# Patient Record
Sex: Female | Born: 1955 | Race: White | Hispanic: No | Marital: Married | Smoking: Never smoker
Health system: Southern US, Community
[De-identification: ages and names within clinical notes are randomized; demographics above are authoritative.]

## PROBLEM LIST (undated history)

## (undated) DIAGNOSIS — Z9889 Other specified postprocedural states: Secondary | ICD-10-CM

## (undated) DIAGNOSIS — E785 Hyperlipidemia, unspecified: Secondary | ICD-10-CM

## (undated) DIAGNOSIS — E559 Vitamin D deficiency, unspecified: Secondary | ICD-10-CM

## (undated) HISTORY — DX: Hyperlipidemia, unspecified: E78.5

## (undated) HISTORY — PX: OTHER SURGICAL HISTORY: SHX169

## (undated) HISTORY — DX: Vitamin D deficiency, unspecified: E55.9

---

## 1989-05-19 HISTORY — PX: ABDOMINAL HYSTERECTOMY: SUR658

## 2001-10-04 ENCOUNTER — Ambulatory Visit (HOSPITAL_COMMUNITY): Admission: RE | Admit: 2001-10-04 | Discharge: 2001-10-04 | Payer: Self-pay | Admitting: Pulmonary Disease

## 2001-11-17 ENCOUNTER — Ambulatory Visit (HOSPITAL_COMMUNITY): Admission: RE | Admit: 2001-11-17 | Discharge: 2001-11-17 | Payer: Self-pay | Admitting: Pulmonary Disease

## 2001-12-07 ENCOUNTER — Ambulatory Visit (HOSPITAL_COMMUNITY): Admission: RE | Admit: 2001-12-07 | Discharge: 2001-12-07 | Payer: Self-pay | Admitting: Cardiology

## 2001-12-08 ENCOUNTER — Ambulatory Visit (HOSPITAL_COMMUNITY): Admission: RE | Admit: 2001-12-08 | Discharge: 2001-12-08 | Payer: Self-pay | Admitting: Cardiology

## 2002-01-04 ENCOUNTER — Ambulatory Visit (HOSPITAL_COMMUNITY): Admission: RE | Admit: 2002-01-04 | Discharge: 2002-01-04 | Payer: Self-pay | Admitting: Pulmonary Disease

## 2002-03-15 ENCOUNTER — Ambulatory Visit (HOSPITAL_COMMUNITY): Admission: RE | Admit: 2002-03-15 | Discharge: 2002-03-15 | Payer: Self-pay | Admitting: Pulmonary Disease

## 2002-03-17 ENCOUNTER — Encounter (HOSPITAL_COMMUNITY): Admission: RE | Admit: 2002-03-17 | Discharge: 2002-04-16 | Payer: Self-pay | Admitting: Pulmonary Disease

## 2004-02-26 ENCOUNTER — Ambulatory Visit (HOSPITAL_COMMUNITY): Admission: RE | Admit: 2004-02-26 | Discharge: 2004-02-26 | Payer: Self-pay | Admitting: Specialist

## 2005-03-04 ENCOUNTER — Ambulatory Visit (HOSPITAL_COMMUNITY): Admission: RE | Admit: 2005-03-04 | Discharge: 2005-03-04 | Payer: Self-pay | Admitting: Specialist

## 2005-04-09 ENCOUNTER — Ambulatory Visit (HOSPITAL_COMMUNITY): Admission: RE | Admit: 2005-04-09 | Discharge: 2005-04-09 | Payer: Self-pay | Admitting: Specialist

## 2006-04-10 ENCOUNTER — Ambulatory Visit (HOSPITAL_COMMUNITY): Admission: RE | Admit: 2006-04-10 | Discharge: 2006-04-10 | Payer: Self-pay | Admitting: Specialist

## 2007-04-20 ENCOUNTER — Ambulatory Visit (HOSPITAL_COMMUNITY): Admission: RE | Admit: 2007-04-20 | Discharge: 2007-04-20 | Payer: Self-pay | Admitting: Specialist

## 2008-04-25 ENCOUNTER — Ambulatory Visit (HOSPITAL_COMMUNITY): Admission: RE | Admit: 2008-04-25 | Discharge: 2008-04-25 | Payer: Self-pay | Admitting: Specialist

## 2009-04-27 ENCOUNTER — Ambulatory Visit (HOSPITAL_COMMUNITY): Admission: RE | Admit: 2009-04-27 | Discharge: 2009-04-27 | Payer: Self-pay | Admitting: Pulmonary Disease

## 2010-05-03 ENCOUNTER — Ambulatory Visit (HOSPITAL_COMMUNITY)
Admission: RE | Admit: 2010-05-03 | Discharge: 2010-05-03 | Payer: Self-pay | Source: Home / Self Care | Attending: Specialist | Admitting: Specialist

## 2010-10-04 NOTE — Procedures (Signed)
[facility]  Patient:    Madison Neal, Madison Neal Visit Number: [id number] MRN: [id number]          Service Type: OUT Location: RAD Attending Physician:  Kathlen Brunswick Dictated by:   Weott Bing, M.D. Proc. Date: 12/07/01 Admit Date:  12/08/2001 Discharge Date: 12/08/2001                              Echocardiograms  REFERRING PHYSICIAN:  Drs. Hawkins and Wall.  CLINICAL DATA:  The patient is a 55 year old woman with chest pain.  IMPRESSION: 1. Technically adequate echocardiographic study. 2. Normal left atrium, right atrium, and right ventricle. 3. ______ mitral valve with flat coaptation but no definite prolapse; no    regurgitation. 4. Normal aortic valve. 5. Normal tricuspid and pulmonic valves; physiologic tricuspid regurgitation. 6. Normal IVC. 7. Normal internal dimension, wall thickness, regional and global function of    the left ventricle. Dictated by:   Mancelona Bing, M.D. Attending Physician:  Kathlen Brunswick DD:  12/07/01 TD:  12/13/01 Job: [id number]

## 2011-04-02 ENCOUNTER — Other Ambulatory Visit (HOSPITAL_COMMUNITY): Payer: Self-pay | Admitting: Pulmonary Disease

## 2011-04-02 DIAGNOSIS — Z139 Encounter for screening, unspecified: Secondary | ICD-10-CM

## 2011-05-05 ENCOUNTER — Ambulatory Visit (HOSPITAL_COMMUNITY)
Admission: RE | Admit: 2011-05-05 | Discharge: 2011-05-05 | Disposition: A | Discharge status: Home / Self Care | Payer: BC Managed Care – PPO | Source: Ambulatory Visit | Attending: Pulmonary Disease | Admitting: Pulmonary Disease

## 2011-05-05 DIAGNOSIS — Z139 Encounter for screening, unspecified: Secondary | ICD-10-CM

## 2011-05-05 DIAGNOSIS — Z1231 Encounter for screening mammogram for malignant neoplasm of breast: Secondary | ICD-10-CM | POA: Insufficient documentation

## 2012-04-23 ENCOUNTER — Other Ambulatory Visit (HOSPITAL_COMMUNITY): Payer: Self-pay | Admitting: Pulmonary Disease

## 2012-04-23 DIAGNOSIS — Z139 Encounter for screening, unspecified: Secondary | ICD-10-CM

## 2012-05-06 ENCOUNTER — Ambulatory Visit (HOSPITAL_COMMUNITY): Payer: BC Managed Care – PPO

## 2012-05-13 ENCOUNTER — Ambulatory Visit (HOSPITAL_COMMUNITY)
Admission: RE | Admit: 2012-05-13 | Discharge: 2012-05-13 | Disposition: A | Discharge status: Home / Self Care | Payer: BC Managed Care – PPO | Source: Ambulatory Visit | Attending: Pulmonary Disease | Admitting: Pulmonary Disease

## 2012-05-13 DIAGNOSIS — Z1231 Encounter for screening mammogram for malignant neoplasm of breast: Secondary | ICD-10-CM | POA: Insufficient documentation

## 2012-05-13 DIAGNOSIS — Z139 Encounter for screening, unspecified: Secondary | ICD-10-CM

## 2012-08-13 ENCOUNTER — Ambulatory Visit (INDEPENDENT_AMBULATORY_CARE_PROVIDER_SITE_OTHER): Payer: BC Managed Care – PPO | Admitting: Obstetrics & Gynecology

## 2012-08-13 ENCOUNTER — Encounter: Payer: Self-pay | Admitting: Obstetrics & Gynecology

## 2012-08-13 VITALS — BP 120/70 | Ht 60.0 in | Wt 118.0 lb

## 2012-08-13 DIAGNOSIS — Z01419 Encounter for gynecological examination (general) (routine) without abnormal findings: Secondary | ICD-10-CM

## 2012-08-13 NOTE — Patient Instructions (Signed)
Hypertriglyceridemia  Diet for High blood levels of Triglycerides Most fats in food are triglycerides. Triglycerides in your blood are stored as fat in your body. High levels of triglycerides in your blood may put you at a greater risk for heart disease and stroke.  Normal triglyceride levels are less than 150 mg/dL. Borderline high levels are 150-199 mg/dl. High levels are 200 - 499 mg/dL, and very high triglyceride levels are greater than 500 mg/dL. The decision to treat high triglycerides is generally based on the level. For people with borderline or high triglyceride levels, treatment includes weight loss and exercise. Drugs are recommended for people with very high triglyceride levels. Many people who need treatment for high triglyceride levels have metabolic syndrome. This syndrome is a collection of disorders that often include: insulin resistance, high blood pressure, blood clotting problems, high cholesterol and triglycerides. TESTING PROCEDURE FOR TRIGLYCERIDES  You should not eat 4 hours before getting your triglycerides measured. The normal range of triglycerides is between 10 and 250 milligrams per deciliter (mg/dl). Some people may have extreme levels (1000 or above), but your triglyceride level may be too high if it is above 150 mg/dl, depending on what other risk factors you have for heart disease.  People with high blood triglycerides may also have high blood cholesterol levels. If you have high blood cholesterol as well as high blood triglycerides, your risk for heart disease is probably greater than if you only had high triglycerides. High blood cholesterol is one of the main risk factors for heart disease. CHANGING YOUR DIET  Your weight can affect your blood triglyceride level. If you are more than 20% above your ideal body weight, you may be able to lower your blood triglycerides by losing weight. Eating less and exercising regularly is the best way to combat this. Fat provides more  calories than any other food. The best way to lose weight is to eat less fat. Only 30% of your total calories should come from fat. Less than 7% of your diet should come from saturated fat. A diet low in fat and saturated fat is the same as a diet to decrease blood cholesterol. By eating a diet lower in fat, you may lose weight, lower your blood cholesterol, and lower your blood triglyceride level.  Eating a diet low in fat, especially saturated fat, may also help you lower your blood triglyceride level. Ask your dietitian to help you figure how much fat you can eat based on the number of calories your caregiver has prescribed for you.  Exercise, in addition to helping with weight loss may also help lower triglyceride levels.   Alcohol can increase blood triglycerides. You may need to stop drinking alcoholic beverages.  Too much carbohydrate in your diet may also increase your blood triglycerides. Some complex carbohydrates are necessary in your diet. These may include bread, rice, potatoes, other starchy vegetables and cereals.  Reduce "simple" carbohydrates. These may include pure sugars, candy, honey, and jelly without losing other nutrients. If you have the kind of high blood triglycerides that is affected by the amount of carbohydrates in your diet, you will need to eat less sugar and less high-sugar foods. Your caregiver can help you with this.  Adding 2-4 grams of fish oil (EPA+ DHA) may also help lower triglycerides. Speak with your caregiver before adding any supplements to your regimen. Following the Diet  Maintain your ideal weight. Your caregivers can help you with a diet. Generally, eating less food and getting more   exercise will help you lose weight. Joining a weight control group may also help. Ask your caregivers for a good weight control group in your area.  Eat low-fat foods instead of high-fat foods. This can help you lose weight too.  These foods are lower in fat. Eat MORE of these:    Dried beans, peas, and lentils.  Egg whites.  Low-fat cottage cheese.  Fish.  Lean cuts of meat, such as round, sirloin, rump, and flank (cut extra fat off meat you fix).  Whole grain breads, cereals and pasta.  Skim and nonfat dry milk.  Low-fat yogurt.  Poultry without the skin.  Cheese made with skim or part-skim milk, such as mozzarella, parmesan, farmers', ricotta, or pot cheese. These are higher fat foods. Eat LESS of these:   Whole milk and foods made from whole milk, such as American, blue, cheddar, monterey jack, and swiss cheese  High-fat meats, such as luncheon meats, sausages, knockwurst, bratwurst, hot dogs, ribs, corned beef, ground pork, and regular ground beef.  Fried foods. Limit saturated fats in your diet. Substituting unsaturated fat for saturated fat may decrease your blood triglyceride level. You will need to read package labels to know which products contain saturated fats.  These foods are high in saturated fat. Eat LESS of these:   Fried pork skins.  Whole milk.  Skin and fat from poultry.  Palm oil.  Butter.  Shortening.  Cream cheese.  Bacon.  Margarines and baked goods made from listed oils.  Vegetable shortenings.  Chitterlings.  Fat from meats.  Coconut oil.  Palm kernel oil.  Lard.  Cream.  Sour cream.  Fatback.  Coffee whiteners and non-dairy creamers made with these oils.  Cheese made from whole milk. Use unsaturated fats (both polyunsaturated and monounsaturated) moderately. Remember, even though unsaturated fats are better than saturated fats; you still want a diet low in total fat.  These foods are high in unsaturated fat:   Canola oil.  Sunflower oil.  Mayonnaise.  Almonds.  Peanuts.  Pine nuts.  Margarines made with these oils.  Safflower oil.  Olive oil.  Avocados.  Cashews.  Peanut butter.  Sunflower seeds.  Soybean oil.  Peanut  oil.  Olives.  Pecans.  Walnuts.  Pumpkin seeds. Avoid sugar and other high-sugar foods. This will decrease carbohydrates without decreasing other nutrients. Sugar in your food goes rapidly to your blood. When there is excess sugar in your blood, your liver may use it to make more triglycerides. Sugar also contains calories without other important nutrients.  Eat LESS of these:   Sugar, brown sugar, powdered sugar, jam, jelly, preserves, honey, syrup, molasses, pies, candy, cakes, cookies, frosting, pastries, colas, soft drinks, punches, fruit drinks, and regular gelatin.  Avoid alcohol. Alcohol, even more than sugar, may increase blood triglycerides. In addition, alcohol is high in calories and low in nutrients. Ask for sparkling water, or a diet soft drink instead of an alcoholic beverage. Suggestions for planning and preparing meals   Bake, broil, grill or roast meats instead of frying.  Remove fat from meats and skin from poultry before cooking.  Add spices, herbs, lemon juice or vinegar to vegetables instead of salt, rich sauces or gravies.  Use a non-stick skillet without fat or use no-stick sprays.  Cool and refrigerate stews and broth. Then remove the hardened fat floating on the surface before serving.  Refrigerate meat drippings and skim off fat to make low-fat gravies.  Serve more fish.  Use less butter,   margarine and other high-fat spreads on bread or vegetables.  Use skim or reconstituted non-fat dry milk for cooking.  Cook with low-fat cheeses.  Substitute low-fat yogurt or cottage cheese for all or part of the sour cream in recipes for sauces, dips or congealed salads.  Use half yogurt/half mayonnaise in salad recipes.  Substitute evaporated skim milk for cream. Evaporated skim milk or reconstituted non-fat dry milk can be whipped and substituted for whipped cream in certain recipes.  Choose fresh fruits for dessert instead of high-fat foods such as pies or  cakes. Fruits are naturally low in fat. When Dining Out   Order low-fat appetizers such as fruit or vegetable juice, pasta with vegetables or tomato sauce.  Select clear, rather than cream soups.  Ask that dressings and gravies be served on the side. Then use less of them.  Order foods that are baked, broiled, poached, steamed, stir-fried, or roasted.  Ask for margarine instead of butter, and use only a small amount.  Drink sparkling water, unsweetened tea or coffee, or diet soft drinks instead of alcohol or other sweet beverages. QUESTIONS AND ANSWERS ABOUT OTHER FATS IN THE BLOOD: SATURATED FAT, TRANS FAT, AND CHOLESTEROL What is trans fat? Trans fat is a type of fat that is formed when vegetable oil is hardened through a process called hydrogenation. This process helps makes foods more solid, gives them shape, and prolongs their shelf life. Trans fats are also called hydrogenated or partially hydrogenated oils.  What do saturated fat, trans fat, and cholesterol in foods have to do with heart disease? Saturated fat, trans fat, and cholesterol in the diet all raise the level of LDL "bad" cholesterol in the blood. The higher the LDL cholesterol, the greater the risk for coronary heart disease (CHD). Saturated fat and trans fat raise LDL similarly.  What foods contain saturated fat, trans fat, and cholesterol? High amounts of saturated fat are found in animal products, such as fatty cuts of meat, chicken skin, and full-fat dairy products like butter, whole milk, cream, and cheese, and in tropical vegetable oils such as palm, palm kernel, and coconut oil. Trans fat is found in some of the same foods as saturated fat, such as vegetable shortening, some margarines (especially hard or stick margarine), crackers, cookies, baked goods, fried foods, salad dressings, and other processed foods made with partially hydrogenated vegetable oils. Small amounts of trans fat also occur naturally in some animal  products, such as milk products, beef, and lamb. Foods high in cholesterol include liver, other organ meats, egg yolks, shrimp, and full-fat dairy products. How can I use the new food label to make heart-healthy food choices? Check the Nutrition Facts panel of the food label. Choose foods lower in saturated fat, trans fat, and cholesterol. For saturated fat and cholesterol, you can also use the Percent Daily Value (%DV): 5% DV or less is low, and 20% DV or more is high. (There is no %DV for trans fat.) Use the Nutrition Facts panel to choose foods low in saturated fat and cholesterol, and if the trans fat is not listed, read the ingredients and limit products that list shortening or hydrogenated or partially hydrogenated vegetable oil, which tend to be high in trans fat. POINTS TO REMEMBER:   Discuss your risk for heart disease with your caregivers, and take steps to reduce risk factors.  Change your diet. Choose foods that are low in saturated fat, trans fat, and cholesterol.  Add exercise to your daily routine if   it is not already being done. Participate in physical activity of moderate intensity, like brisk walking, for at least 30 minutes on most, and preferably all days of the week. No time? Break the 30 minutes into three, 10-minute segments during the day.  Stop smoking. If you do smoke, contact your caregiver to discuss ways in which they can help you quit.  Do not use street drugs.  Maintain a normal weight.  Maintain a healthy blood pressure.  Keep up with your blood work for checking the fats in your blood as directed by your caregiver. Document Released: 02/21/2004 Document Revised: 11/04/2011 Document Reviewed: 09/18/2008 [employer] Patient Information 2013 [employer].  

## 2012-08-13 NOTE — Progress Notes (Signed)
Patient ID: Madison Neal, female   DOB: 1955-12-20, 57 y.o.   MRN: [id number] Subjective:     Madison Neal is a 57 y.o. female here for a routine exam.  Current complaints: none.  Personal health questionnaire reviewed: yes.   Gynecologic History No LMP recorded. Patient has had a hysterectomy. Contraception: status post hysterectomy Last Pap: 10 years. Results were: normal Last mammogram: 2013. Results were: normal  Obstetric History OB History   Grav Para Term Preterm Abortions TAB SAB Ect Mult Living   1 1             # Outc Date GA Lbr Len/2nd Wgt Sex Del Anes PTL Lv   1 PAR                The following portions of the patient's history were reviewed and updated as appropriate: allergies, current medications, past family history, past medical history, past social history, past surgical history and problem list.  Review of Systems      Review of Systems  Constitutional: Negative for fever, chills, weight loss, malaise/fatigue and diaphoresis.  HENT: Negative for hearing loss, ear pain, nosebleeds, congestion, sore throat, neck pain, tinnitus and ear discharge.   Eyes: Negative for blurred vision, double vision, photophobia, pain, discharge and redness.  Respiratory: Negative for cough, hemoptysis, sputum production, shortness of breath, wheezing and stridor.   Cardiovascular: Negative for chest pain, palpitations, orthopnea, claudication, leg swelling and PND.  Gastrointestinal: Negative for abdominal pain. Negative for heartburn, nausea, vomiting, diarrhea, constipation, blood in stool and melena.  Genitourinary: Negative for dysuria, urgency, frequency, hematuria and flank pain.  Musculoskeletal: Negative for myalgias, back pain, joint pain and falls.  Skin: Negative for itching and rash.  Neurological: Negative for dizziness, tingling, tremors, sensory change, speech change, focal weakness, seizures, loss of consciousness, weakness and headaches.   Endo/Heme/Allergies: Negative for environmental allergies and polydipsia. Does not bruise/bleed easily.  Psychiatric/Behavioral: Negative for depression, suicidal ideas, hallucinations, memory loss and substance abuse. The patient is not nervous/anxious and does not have insomnia.       Objective:    BP 120/70  Ht 5' (1.524 m)  Wt 118 lb (53.524 kg)  BMI 23.05 kg/m2  General Appearance:    Alert, cooperative, no distress, appears stated age  Head:    Normocephalic, without obvious abnormality, atraumatic  Eyes:    PERRL, conjunctiva/corneas clear, EOM's intact, fundi    benign, both eyes  Ears:    Normal TM's and external ear canals, both ears  Nose:   Nares normal, septum midline, mucosa normal, no drainage    or sinus tenderness  Throat:   Lips, mucosa, and tongue normal; teeth and gums normal  Neck:   Supple, symmetrical, trachea midline, no adenopathy;    thyroid:  no enlargement/tenderness/nodules; no carotid   bruit or JVD  Back:     Symmetric, no curvature, ROM normal, no CVA tenderness  Lungs:     Clear to auscultation bilaterally, respirations unlabored  Chest Wall:    No tenderness or deformity   Heart:    Regular rate and rhythm, S1 and S2 normal, no murmur, rub   or gallop  Breast Exam:    No tenderness, masses, or nipple abnormality  Abdomen:     Soft, non-tender, bowel sounds active all four quadrants,    no masses, no organomegaly  Genitalia:    Normal female without lesion, discharge or tenderness  Rectal:    Normal tone, normal prostate,  no masses or tenderness;   guaiac negative stool  Extremities:   Extremities normal, atraumatic, no cyanosis or edema  Pulses:   2+ and symmetric all extremities  Skin:   Skin color, texture, turgor normal, no rashes or lesions  Lymph nodes:   Cervical, supraclavicular, and axillary nodes normal  Neurologic:   CNII-XII intact, normal strength, sensation and reflexes    throughout     Vagina post menopausal no adnexal  masses Assessment:    Healthy female exam.    Plan:    Follow up in: 2 years.

## 2013-04-15 ENCOUNTER — Other Ambulatory Visit (HOSPITAL_COMMUNITY): Payer: Self-pay | Admitting: Pulmonary Disease

## 2013-04-15 DIAGNOSIS — Z139 Encounter for screening, unspecified: Secondary | ICD-10-CM

## 2013-05-06 ENCOUNTER — Encounter: Payer: Self-pay | Admitting: Family Medicine

## 2013-05-16 ENCOUNTER — Ambulatory Visit (HOSPITAL_COMMUNITY)
Admission: RE | Admit: 2013-05-16 | Discharge: 2013-05-16 | Disposition: A | Discharge status: Home / Self Care | Payer: BC Managed Care – PPO | Source: Ambulatory Visit | Attending: Pulmonary Disease | Admitting: Pulmonary Disease

## 2013-05-16 DIAGNOSIS — Z139 Encounter for screening, unspecified: Secondary | ICD-10-CM

## 2013-05-16 DIAGNOSIS — Z1231 Encounter for screening mammogram for malignant neoplasm of breast: Secondary | ICD-10-CM | POA: Insufficient documentation

## 2013-05-24 ENCOUNTER — Other Ambulatory Visit: Payer: Self-pay | Admitting: Pulmonary Disease

## 2013-05-24 DIAGNOSIS — R928 Other abnormal and inconclusive findings on diagnostic imaging of breast: Secondary | ICD-10-CM

## 2013-06-08 ENCOUNTER — Ambulatory Visit (HOSPITAL_COMMUNITY)
Admission: RE | Admit: 2013-06-08 | Discharge: 2013-06-08 | Disposition: A | Discharge status: Home / Self Care | Payer: BC Managed Care – PPO | Source: Ambulatory Visit | Attending: Pulmonary Disease | Admitting: Pulmonary Disease

## 2013-06-08 DIAGNOSIS — R928 Other abnormal and inconclusive findings on diagnostic imaging of breast: Secondary | ICD-10-CM

## 2013-06-08 DIAGNOSIS — Z1231 Encounter for screening mammogram for malignant neoplasm of breast: Secondary | ICD-10-CM | POA: Insufficient documentation

## 2014-03-20 ENCOUNTER — Encounter: Payer: Self-pay | Admitting: Obstetrics & Gynecology

## 2014-06-05 ENCOUNTER — Other Ambulatory Visit: Payer: Self-pay | Admitting: Obstetrics & Gynecology

## 2014-06-05 DIAGNOSIS — Z1231 Encounter for screening mammogram for malignant neoplasm of breast: Secondary | ICD-10-CM

## 2014-06-12 ENCOUNTER — Ambulatory Visit (HOSPITAL_COMMUNITY): Payer: Self-pay

## 2014-06-14 ENCOUNTER — Ambulatory Visit (HOSPITAL_COMMUNITY)
Admission: RE | Admit: 2014-06-14 | Discharge: 2014-06-14 | Disposition: A | Discharge status: Home / Self Care | Payer: BLUE CROSS/BLUE SHIELD | Source: Ambulatory Visit | Attending: Obstetrics & Gynecology | Admitting: Obstetrics & Gynecology

## 2014-06-14 DIAGNOSIS — Z1231 Encounter for screening mammogram for malignant neoplasm of breast: Secondary | ICD-10-CM | POA: Diagnosis present

## 2015-06-04 ENCOUNTER — Other Ambulatory Visit (HOSPITAL_COMMUNITY): Payer: Self-pay | Admitting: Pulmonary Disease

## 2015-06-04 DIAGNOSIS — Z1231 Encounter for screening mammogram for malignant neoplasm of breast: Secondary | ICD-10-CM

## 2015-06-18 ENCOUNTER — Ambulatory Visit (HOSPITAL_COMMUNITY)
Admission: RE | Admit: 2015-06-18 | Discharge: 2015-06-18 | Disposition: A | Discharge status: Home / Self Care | Payer: BLUE CROSS/BLUE SHIELD | Source: Ambulatory Visit | Attending: Pulmonary Disease | Admitting: Pulmonary Disease

## 2015-06-18 DIAGNOSIS — Z1231 Encounter for screening mammogram for malignant neoplasm of breast: Secondary | ICD-10-CM | POA: Insufficient documentation

## 2016-05-28 ENCOUNTER — Other Ambulatory Visit (HOSPITAL_COMMUNITY): Payer: Self-pay | Admitting: Pulmonary Disease

## 2016-05-28 DIAGNOSIS — Z1231 Encounter for screening mammogram for malignant neoplasm of breast: Secondary | ICD-10-CM

## 2016-06-20 ENCOUNTER — Ambulatory Visit (HOSPITAL_COMMUNITY)
Admission: RE | Admit: 2016-06-20 | Discharge: 2016-06-20 | Disposition: A | Discharge status: Home / Self Care | Payer: BLUE CROSS/BLUE SHIELD | Source: Ambulatory Visit | Attending: Pulmonary Disease | Admitting: Pulmonary Disease

## 2016-06-20 DIAGNOSIS — Z1231 Encounter for screening mammogram for malignant neoplasm of breast: Secondary | ICD-10-CM | POA: Diagnosis not present

## 2017-03-20 ENCOUNTER — Ambulatory Visit (INDEPENDENT_AMBULATORY_CARE_PROVIDER_SITE_OTHER): Payer: Commercial Managed Care - PPO

## 2017-03-20 ENCOUNTER — Ambulatory Visit (INDEPENDENT_AMBULATORY_CARE_PROVIDER_SITE_OTHER): Payer: Commercial Managed Care - PPO | Admitting: Podiatry

## 2017-03-20 VITALS — BP 123/80 | HR 77 | Ht 60.0 in | Wt 120.0 lb

## 2017-03-20 DIAGNOSIS — M659 Synovitis and tenosynovitis, unspecified: Secondary | ICD-10-CM | POA: Diagnosis not present

## 2017-03-20 DIAGNOSIS — M79672 Pain in left foot: Secondary | ICD-10-CM

## 2017-03-20 DIAGNOSIS — M2012 Hallux valgus (acquired), left foot: Secondary | ICD-10-CM

## 2017-03-20 DIAGNOSIS — M21612 Bunion of left foot: Secondary | ICD-10-CM | POA: Diagnosis not present

## 2017-03-20 NOTE — Progress Notes (Signed)
   Subjective:    Patient ID: Madison Neal, female    DOB: Oct 14, 1955, 61 y.o.   MRN: [id number]  HPI  Chief Complaint  Patient presents with  . Foot Pain    Left foot, x 3 weeks - possible bunion   61 y.o. female presents with the above complaint.  Reports left foot pain times 3 weeks duration.  Believes her pain to be due to her bunion.  States that she has not noticed this deformity for that it might be progressing.  Pain described as sore.  Pain with shoe gear.  No pain from the joint itself.  No past medical history on file. Past Surgical History:  Procedure Laterality Date  . ABDOMINAL HYSTERECTOMY  1991  . radial tunnel Right     Current Outpatient Medications:  .  simvastatin (ZOCOR) 20 MG tablet, Take 20 mg by mouth daily., Disp: , Rfl:  .  Calcium-Vitamin D (CALTRATE 600 PLUS-VIT D PO), Take by mouth daily., Disp: , Rfl:  .  Cholecalciferol (VITAMIN D-3) 5000 UNITS TABS, Take by mouth daily., Disp: , Rfl:  .  KRILL OIL OMEGA-3 PO, Take by mouth daily., Disp: , Rfl:  .  Red Yeast Rice Extract (RED YEAST RICE PO), Take by mouth daily., Disp: , Rfl:   No Known Allergies   Review of Systems  All other systems reviewed and are negative.      Objective:   Physical Exam Vitals:   03/20/17 1123  BP: 123/80  Pulse: 77   General AA&O x3. Normal mood and affect.  Vascular Dorsalis pedis and posterior tibial pulses  present 2+ bilaterally. Capillary refill normal to all digits. Pedal hair growth normal.  Neurologic Epicritic sensation grossly intact.  Dermatologic No open lesions. Interspaces clear of maceration.  Normal skin temperature and turgor. Hyperkeratotic lesions: none bilaterally  Orthopedic: MMT 5/5 in dorsiflexion, plantarflexion, inversion, and eversion. Hallux abductovalgus deformity present - left Left 1st MPJ diminished range of motion. Left 1st TMT without gross hypermobility. Lesser digital contractures present left.   Radiographs: Taken and  reviewed. Hallux abductovalgus deformity present. Metatarsal parabola normal. 1st/2nd IMA: moderately increased      Assessment & Plan:  Patient was evaluated and treated and all questions answered  HAV with Bunion, Left -Radiographs reviewed as above -Discussed treatment options including padding versus conservative therapy.  Patient wishes to proceed with a trial of conservative management -Tube foam padding dispensed  Return in about 4 weeks (around 04/17/2017) for bunion f/u.

## 2017-04-17 ENCOUNTER — Ambulatory Visit: Payer: Commercial Managed Care - PPO | Admitting: Podiatry

## 2017-05-01 ENCOUNTER — Ambulatory Visit (INDEPENDENT_AMBULATORY_CARE_PROVIDER_SITE_OTHER): Payer: Commercial Managed Care - PPO | Admitting: Podiatry

## 2017-05-01 ENCOUNTER — Encounter: Payer: Self-pay | Admitting: Podiatry

## 2017-05-01 DIAGNOSIS — M21612 Bunion of left foot: Secondary | ICD-10-CM | POA: Diagnosis not present

## 2017-05-01 DIAGNOSIS — M2012 Hallux valgus (acquired), left foot: Secondary | ICD-10-CM | POA: Diagnosis not present

## 2017-05-03 NOTE — Progress Notes (Signed)
  Subjective:  Patient ID: Madison Neal, female    DOB: March 05, 1956,  MRN: [id number]  Chief Complaint  Patient presents with  . Foot Pain    Follow up bunion left   "I did everything he told me too, but its still sore"   61 y.o. female returns for the above complaint. States she has padded off the area as directed but it still hurts.  Objective:  There were no vitals filed for this visit. General AA&O x3. Normal mood and affect.  Vascular Pedal pulses palpable.  Neurologic Epicritic sensation grossly intact.  Dermatologic No open lesions. Skin normal texture and turgor.  Orthopedic: L 1st MPJ with HAV deformity, pain to palpation. No pain on ROM of the joint.   Assessment & Plan:  Patient was evaluated and treated and all questions answered.  HAV with Bunion L Foot -Patient has failed conservative therapy and wishes to proceed with surgical management.  All risk benefits and alternatives discussed with patient.  Consent reviewed and signed by patient.  Will plan for bunion correction with head osteotomy.  To be performed at a date to be determined by the surgical coordinator.  15 minutes of face to face time were spent with the patient. >50% of this was spent on counseling and coordination of care. Specifically discussed with patient the above diagnosis and treatment plan.  Return for post-op.

## 2017-06-03 ENCOUNTER — Other Ambulatory Visit: Payer: Self-pay | Admitting: Podiatry

## 2017-06-03 DIAGNOSIS — M2012 Hallux valgus (acquired), left foot: Secondary | ICD-10-CM

## 2017-06-03 MED ORDER — ONDANSETRON HCL 4 MG PO TABS: 4.0000 mg | ORAL_TABLET | Freq: Three times a day (TID) | ORAL | 0 refills | Status: DC | PRN

## 2017-06-03 MED ORDER — CEPHALEXIN 500 MG PO CAPS: 500.0000 mg | ORAL_CAPSULE | Freq: Two times a day (BID) | ORAL | 0 refills | Status: DC

## 2017-06-03 MED ORDER — OXYCODONE-ACETAMINOPHEN 10-325 MG PO TABS: 1.0000 | ORAL_TABLET | ORAL | 0 refills | Status: DC | PRN

## 2017-06-04 ENCOUNTER — Telehealth: Payer: Self-pay | Admitting: Podiatry

## 2017-06-04 ENCOUNTER — Encounter: Payer: Self-pay | Admitting: Podiatry

## 2017-06-04 NOTE — Progress Notes (Signed)
06/03/17 L Bunion Correction

## 2017-06-04 NOTE — Telephone Encounter (Signed)
Attempted to call patient for post-op check. No answer. VM left.

## 2017-06-05 NOTE — Telephone Encounter (Signed)
Called patient again. Patient reports nausea, improved by Zofran. Pain controlled. No acute post-operative issues thus far. Re-educated on icing and elevating for pain control.

## 2017-06-10 ENCOUNTER — Encounter: Payer: Self-pay | Admitting: Podiatry

## 2017-06-10 ENCOUNTER — Ambulatory Visit (INDEPENDENT_AMBULATORY_CARE_PROVIDER_SITE_OTHER): Payer: Commercial Managed Care - PPO | Admitting: Podiatry

## 2017-06-10 ENCOUNTER — Ambulatory Visit (INDEPENDENT_AMBULATORY_CARE_PROVIDER_SITE_OTHER): Payer: Commercial Managed Care - PPO

## 2017-06-10 DIAGNOSIS — M21619 Bunion of unspecified foot: Secondary | ICD-10-CM

## 2017-06-10 MED ORDER — DOXYCYCLINE HYCLATE 100 MG PO TABS: 100.0000 mg | ORAL_TABLET | Freq: Two times a day (BID) | ORAL | 0 refills | Status: DC

## 2017-06-10 NOTE — Progress Notes (Signed)
  Subjective:  Patient ID: Madison Neal, female    DOB: 1955-10-29,  MRN: [id number]  Chief Complaint  Patient presents with  . Routine Post Op    pov#1 [place]   DOS: 06/03/17 Procedure: Raoul PitchL [place] Bunionectomy  62 y.o. female returns for post-op check. Denies N/V/F/Ch. Pain is controlled with current medications. Has not been taking much pain medication due to constipation  Objective:   General AA&O x3. Normal mood and affect.  Vascular Foot warm and well perfused.  Neurologic Gross sensation intact.  Dermatologic Skin healing well with intact suture. Periwound blanchable erythema noted with warmth. No drainage. No ascending cellulitis  Orthopedic: Tenderness to palpation noted about the surgical site.   Assessment & Plan:  Patient was evaluated and treated and all questions answered.  S/p L [place] Bunionectomy -XR reviewed c/w post-op state -Progressing as expected post-operatively. -Sutures: intact. -Medications refilled: Rx Doxycycline due to excessive periwound erythema. -Foot redressed.  Follow up this Friday for wound recheck.  No Follow-up on file.

## 2017-06-11 ENCOUNTER — Other Ambulatory Visit (HOSPITAL_COMMUNITY): Payer: Self-pay | Admitting: Pulmonary Disease

## 2017-06-11 DIAGNOSIS — Z1231 Encounter for screening mammogram for malignant neoplasm of breast: Secondary | ICD-10-CM

## 2017-06-12 ENCOUNTER — Ambulatory Visit (INDEPENDENT_AMBULATORY_CARE_PROVIDER_SITE_OTHER): Payer: Commercial Managed Care - PPO | Admitting: Podiatry

## 2017-06-12 ENCOUNTER — Encounter: Payer: Self-pay | Admitting: Podiatry

## 2017-06-12 VITALS — BP 115/72 | HR 72 | Temp 98.5°F

## 2017-06-12 DIAGNOSIS — T8189XD Other complications of procedures, not elsewhere classified, subsequent encounter: Secondary | ICD-10-CM

## 2017-06-12 MED ORDER — CIPROFLOXACIN HCL 250 MG PO TABS: 250.0000 mg | ORAL_TABLET | Freq: Two times a day (BID) | ORAL | 0 refills | Status: DC

## 2017-06-15 ENCOUNTER — Ambulatory Visit (INDEPENDENT_AMBULATORY_CARE_PROVIDER_SITE_OTHER): Payer: Self-pay

## 2017-06-15 DIAGNOSIS — T8189XD Other complications of procedures, not elsewhere classified, subsequent encounter: Secondary | ICD-10-CM

## 2017-06-15 DIAGNOSIS — M2012 Hallux valgus (acquired), left foot: Secondary | ICD-10-CM

## 2017-06-15 DIAGNOSIS — M21619 Bunion of unspecified foot: Secondary | ICD-10-CM

## 2017-06-15 LAB — WOUND CULTURE
MICRO NUMBER: 90108928
RESULT: NO GROWTH
SPECIMEN QUALITY: ADEQUATE

## 2017-06-15 NOTE — Progress Notes (Signed)
Patient presents s/p [place] bunion[place] surgery on 1.16.19. She states that her pain has improved a lot and her foot only bothers her when she walks on it for prolonged periods.   Removed soiled dressing. Noted well surgical site, no gapping, mild erythema periwound, blanchable, improved since last office visit. Swelling still present but improved since last visit.   Informed patient to continue with antibiotics and keeping the foot dry. She is to remain in the boot at all times, ice and elevate throughout the day. Keep follow up ap  pt with Dr Samuella CotaPrice. Pending wound cultures

## 2017-06-16 NOTE — Progress Notes (Signed)
  Subjective:  Patient ID: Madison Neal, female    DOB: 24-Feb-1956,  MRN: [id number]  Chief Complaint  Patient presents with  . Routine Post Op    my foot still hurts and is red and swollen   DOS: 06/03/17 Procedure: Raoul PitchL [place] Bunionectomy  62 y.o. female returns for post-op check. Denies N/V/F/Ch.  Still having pain but it is somewhat improved.  States the area is still red and swollen.  Objective:   General AA&O x3. Normal mood and affect.  Vascular Foot warm and well perfused.  Neurologic Gross sensation intact.  Dermatologic Skin healing well with intact suture.  Periwound erythema noted without ascending cellulitis.  Warmth noted.  Slight serosanguineous drainage.  No purulence.  Orthopedic: Tenderness to palpation noted about the surgical site.   Assessment & Plan:  Patient was evaluated and treated and all questions answered.  S/p L [place] Bunionectomy -Continue redness noted today.  Wound culture performed. Medications refilled: Add on Cipro -Foot redressed.  Follow up Monday with RN for wound check to ensure redness is decreasing.  Follow-up culture results of the time  Return in about 3 days (around 06/15/2017).

## 2017-06-17 ENCOUNTER — Ambulatory Visit (INDEPENDENT_AMBULATORY_CARE_PROVIDER_SITE_OTHER): Payer: Commercial Managed Care - PPO | Admitting: Podiatry

## 2017-06-17 DIAGNOSIS — T8189XD Other complications of procedures, not elsewhere classified, subsequent encounter: Secondary | ICD-10-CM

## 2017-06-17 MED ORDER — CIPROFLOXACIN HCL 250 MG PO TABS: 250.0000 mg | ORAL_TABLET | Freq: Two times a day (BID) | ORAL | 0 refills | Status: DC

## 2017-06-17 NOTE — Progress Notes (Signed)
  Subjective:  Patient ID: Madison Neal, female    DOB: [birth year],  MRN: [id number]  No chief complaint on file.  DOS: 06/03/17 Procedure: Raoul PitchL Austin Bunionectomy  62 y.o. female returns for post-op check. Denies N/V/F/Ch.  Pain controlled.  States that she has noticed the redness is much less than it was previously.  Objective:   General AA&O x3. Normal mood and affect.  Vascular Foot warm and well perfused.  Neurologic Gross sensation intact.  Dermatologic Skin healing well with intact suture.  Periwound erythema noted without ascending cellulitis.  Warmth noted.  Slight serosanguineous drainage.  No purulence.  Orthopedic: Tenderness to palpation noted about the surgical site.   Assessment & Plan:  Patient was evaluated and treated and all questions answered.  S/p L Austin Bunionectomy -Wound culture reviewed.  No growth to date.  Redness decreased still slight periwound redness remains.  Will continue Clinda for an additional 5 days as a precaution.  But Medications refilled: Cipro -Foot redressed. -Return in 1 week for likely suture removal-  Return in about 1 week (around 06/24/2017) for Post-op.

## 2017-06-24 ENCOUNTER — Ambulatory Visit (INDEPENDENT_AMBULATORY_CARE_PROVIDER_SITE_OTHER): Payer: Commercial Managed Care - PPO | Admitting: Podiatry

## 2017-06-24 ENCOUNTER — Encounter: Payer: Self-pay | Admitting: Podiatry

## 2017-06-24 DIAGNOSIS — Z9889 Other specified postprocedural states: Secondary | ICD-10-CM | POA: Diagnosis not present

## 2017-07-02 ENCOUNTER — Ambulatory Visit (HOSPITAL_COMMUNITY)
Admission: RE | Admit: 2017-07-02 | Discharge: 2017-07-02 | Disposition: A | Discharge status: Home / Self Care | Payer: Commercial Managed Care - PPO | Source: Ambulatory Visit | Attending: Pulmonary Disease | Admitting: Pulmonary Disease

## 2017-07-02 DIAGNOSIS — Z1231 Encounter for screening mammogram for malignant neoplasm of breast: Secondary | ICD-10-CM | POA: Diagnosis not present

## 2017-07-08 NOTE — Progress Notes (Signed)
  Subjective:  Patient ID: Madison Neal, female    DOB: Sep 09, 1955,  MRN: [id number]  Chief Complaint  Patient presents with  . Routine Post Op    i am doing good and i am still swelling on my foot     DOS: 06/03/17 Procedure: Left Austin bunionectomy  62 y.o. female returns for post-op check. Denies N/V/F/Ch.  Minimal pain.  States that she is still experiencing swelling but not pain.  Objective:   General AA&O x3. Normal mood and affect.  Vascular Foot warm and well perfused.  Neurologic Gross sensation intact.  Dermatologic Skin well healed. Edema noted. No dehiscence. No erythema. No signs of infection.  Orthopedic: Tenderness to palpation noted about the surgical site.    Assessment & Plan:  Patient was evaluated and treated and all questions answered.  S/p L Austin bunionectomy -Progressing as expected post-operatively. -Sutures: removed. -Medications refilled: none -Foot redressed.  Return in about 2 weeks (around 07/08/2017) for Post-op. with new XR

## 2017-07-10 ENCOUNTER — Ambulatory Visit (INDEPENDENT_AMBULATORY_CARE_PROVIDER_SITE_OTHER): Payer: Commercial Managed Care - PPO

## 2017-07-10 ENCOUNTER — Ambulatory Visit (INDEPENDENT_AMBULATORY_CARE_PROVIDER_SITE_OTHER): Payer: Commercial Managed Care - PPO | Admitting: Podiatry

## 2017-07-10 ENCOUNTER — Telehealth: Payer: Self-pay | Admitting: *Deleted

## 2017-07-10 DIAGNOSIS — M2012 Hallux valgus (acquired), left foot: Secondary | ICD-10-CM

## 2017-07-10 DIAGNOSIS — Z9889 Other specified postprocedural states: Secondary | ICD-10-CM

## 2017-07-10 NOTE — Telephone Encounter (Signed)
Faxed required form, and demographics to [facility]. 

## 2017-07-10 NOTE — Progress Notes (Signed)
  Subjective:  Patient ID: Madison Neal, female    DOB: [birth year],  MRN: [id number]  Chief Complaint  Patient presents with  . Routine Post Op    DOS 06-03-17  Austin bunionectomy left     DOS: 06/03/17 Procedure: Left Austin bunionectomy  62 y.o. female returns for post-op check. Denies N/V/F/Ch. States her pain is doing much better. Ambulating in a surgical shoe without difficulty.  Objective:   General AA&O x3. Normal mood and affect.  Vascular Foot warm and well perfused.  Neurologic Gross sensation intact.  Dermatologic Skin well healed. Slight edema noted. No dehiscence. No erythema. No signs of infection.  Orthopedic: No tenderness to palpation noted about the surgical site.    Assessment & Plan:  Patient was evaluated and treated and all questions answered.  S/p L Austin bunionectomy -Progressing as expected post-operatively. -XR taken show consolidation of the fracture site.  -Medications refilled: none -Start PT. [place].  Return in about 4 weeks (around 08/07/2017). with new XR

## 2017-07-10 NOTE — Telephone Encounter (Signed)
-----   Message from Park LiterMichael J Price, DPM sent at 07/10/2017 10:00 AM EST ----- Regarding: PT REferral Can we refer to PT? Patient would like to go to [place] I think there's a [facility] facility there. Thanks!

## 2017-08-07 ENCOUNTER — Ambulatory Visit (INDEPENDENT_AMBULATORY_CARE_PROVIDER_SITE_OTHER): Payer: Commercial Managed Care - PPO | Admitting: Podiatry

## 2017-08-07 ENCOUNTER — Ambulatory Visit (INDEPENDENT_AMBULATORY_CARE_PROVIDER_SITE_OTHER): Payer: Commercial Managed Care - PPO

## 2017-08-07 DIAGNOSIS — Z9889 Other specified postprocedural states: Secondary | ICD-10-CM

## 2017-08-07 DIAGNOSIS — M2012 Hallux valgus (acquired), left foot: Secondary | ICD-10-CM

## 2017-08-07 NOTE — Progress Notes (Signed)
  Subjective:  Patient ID: Clayburn PertCheryl D Keeble, female    DOB: Jul 28, 1955,  MRN: [id number]  No chief complaint on file.  DOS: 06/03/17 Procedure: Left Austin bunionectomy  62 y.o. female returns for post-op check. Doing PT with noted improvement. Walking in normal shoe gear without difficulty. No complaints, pleased with results of surgery.  Objective:   General AA&O x3. Normal mood and affect.  Vascular Foot warm and well perfused.  Neurologic Gross sensation intact.  Dermatologic Skin well healed. No erythema. No warmth or ROM  Orthopedic: No tenderness to palpation noted about the surgical site. Good ROM L 1st MPJ. No joint pain or crepitus on ROM.    Assessment & Plan:  Patient was evaluated and treated and all questions answered.  S/p L Austin bunionectomy -Progressing as expected post-operatively. -XR taken again show full consolidation of the fracture site, no evidence of HWF. -Continue PT at home -Continue WBAT in normal shoegear.  Return in about 1 month (around 09/04/2017) for Post-op. with final XRs

## 2017-08-28 ENCOUNTER — Ambulatory Visit (INDEPENDENT_AMBULATORY_CARE_PROVIDER_SITE_OTHER): Payer: Commercial Managed Care - PPO

## 2017-08-28 ENCOUNTER — Encounter: Payer: Self-pay | Admitting: Podiatry

## 2017-08-28 ENCOUNTER — Ambulatory Visit (INDEPENDENT_AMBULATORY_CARE_PROVIDER_SITE_OTHER): Payer: Commercial Managed Care - PPO | Admitting: Podiatry

## 2017-08-28 DIAGNOSIS — M2012 Hallux valgus (acquired), left foot: Secondary | ICD-10-CM

## 2017-08-28 DIAGNOSIS — Z9889 Other specified postprocedural states: Secondary | ICD-10-CM

## 2017-08-30 NOTE — Progress Notes (Signed)
  Subjective:  Patient ID: Madison Neal, female    DOB: 11-Mar-1956,  MRN: [id number]  Chief Complaint  Patient presents with  . Routine Post Op    DOS 1.16.19 Autin Bunionectomy Lt " my foot feels great, wearing regular shoe x 1 month   DOS: 06/03/17 Procedure: Left Austin bunionectomy  62 y.o. female returns for post-op check. States that her foot feels great.  Has been back in her regular shoe without issue denies pain.  Able to wear sugars without difficulty and walk barefoot without difficulty.  Objective:   General AA&O x3. Normal mood and affect.  Vascular Foot warm and well perfused.  Neurologic Gross sensation intact.  Dermatologic Skin well healed. No erythema. No warmth or ROM  Orthopedic: No tenderness to palpation noted about the surgical site. Good ROM L 1st MPJ. No joint pain or crepitus on ROM.   Assessment & Plan:  Patient was evaluated and treated and all questions answered.  S/p L Austin bunionectomy -Progressing as expected post-operatively. -XR taken again show full consolidation of the fracture site, no evidence of HWF. -Continue WBAT in normal shoegear.  Return in about 3 months (around 11/27/2017) for Bunion f/u.

## 2017-11-27 ENCOUNTER — Ambulatory Visit (INDEPENDENT_AMBULATORY_CARE_PROVIDER_SITE_OTHER): Payer: Commercial Managed Care - PPO

## 2017-11-27 ENCOUNTER — Ambulatory Visit (INDEPENDENT_AMBULATORY_CARE_PROVIDER_SITE_OTHER): Payer: Commercial Managed Care - PPO | Admitting: Podiatry

## 2017-11-27 ENCOUNTER — Encounter: Payer: Self-pay | Admitting: Podiatry

## 2017-11-27 DIAGNOSIS — M2012 Hallux valgus (acquired), left foot: Secondary | ICD-10-CM

## 2017-11-29 NOTE — Progress Notes (Signed)
  Subjective:  Patient ID: Madison Neal, female    DOB: [birth year],  MRN: [id number]  Chief Complaint  Patient presents with  . Routine Post Op    left foot surgery bunionectomy; pt stated, "feeling good, just a little tight but no pain   DOS: 06/03/17 Procedure: Left Austin bunionectomy  62 y.o. female returns for post-op check.  Pleased with results of surgery.  States her toe is still a little stiff but no pain.  Is walking in normal shoe gear without issue  Objective:   General AA&O x3. Normal mood and affect.  Vascular Foot warm and well perfused.  Neurologic Gross sensation intact.  Dermatologic Skin well healed. No erythema. No warmth or ROM  Orthopedic: No tenderness to palpation noted about the surgical site. Good ROM L 1st MPJ. No joint pain or crepitus on ROM.   Assessment & Plan:  Patient was evaluated and treated and all questions answered.  S/p L Austin bunionectomy -Progressing as expected post-operatively. -X final x-rays showed good positioning no evidence of hardware failure osteotomy healed -Continue WBAT in normal shoegear. -Continue range of motion exercises  Return if symptoms worsen or fail to improve.

## 2018-05-28 ENCOUNTER — Other Ambulatory Visit (HOSPITAL_COMMUNITY): Payer: Self-pay | Admitting: Pulmonary Disease

## 2018-05-28 DIAGNOSIS — Z1231 Encounter for screening mammogram for malignant neoplasm of breast: Secondary | ICD-10-CM

## 2018-07-07 ENCOUNTER — Ambulatory Visit (HOSPITAL_COMMUNITY): Payer: Commercial Managed Care - PPO

## 2018-07-08 ENCOUNTER — Ambulatory Visit (HOSPITAL_COMMUNITY): Payer: Commercial Managed Care - PPO

## 2018-07-12 ENCOUNTER — Ambulatory Visit (HOSPITAL_COMMUNITY)
Admission: RE | Admit: 2018-07-12 | Discharge: 2018-07-12 | Disposition: A | Discharge status: Home / Self Care | Payer: Commercial Managed Care - PPO | Source: Ambulatory Visit | Attending: Pulmonary Disease | Admitting: Pulmonary Disease

## 2018-07-12 DIAGNOSIS — Z1231 Encounter for screening mammogram for malignant neoplasm of breast: Secondary | ICD-10-CM

## 2018-07-12 LAB — HM MAMMOGRAPHY

## 2018-11-01 LAB — BASIC METABOLIC PANEL
BUN: 13 (ref 4–21)
Creatinine: 0.7 (ref 0.5–1.1)
Glucose: 93

## 2018-11-01 LAB — VITAMIN D 25 HYDROXY (VIT D DEFICIENCY, FRACTURES): Vit D, 25-Hydroxy: 39

## 2018-11-01 LAB — LIPID PANEL
Cholesterol: 211 — AB (ref 0–200)
HDL: 80 — AB (ref 35–70)
LDL Cholesterol: 117
Triglycerides: 70 (ref 40–160)

## 2018-11-01 LAB — HEPATIC FUNCTION PANEL
ALT: 23 (ref 7–35)
AST: 26 (ref 13–35)
Alkaline Phosphatase: 76 (ref 25–125)
Bilirubin, Total: 0.4

## 2018-11-01 LAB — COMPREHENSIVE METABOLIC PANEL
Albumin: 4.5 (ref 3.5–5.0)
Calcium: 9.5 (ref 8.7–10.7)
GFR calc Af Amer: 102
GFR calc non Af Amer: 89
Globulin: 2.3

## 2019-06-08 ENCOUNTER — Other Ambulatory Visit (HOSPITAL_COMMUNITY): Payer: Self-pay | Admitting: Family Medicine

## 2019-06-08 DIAGNOSIS — Z1231 Encounter for screening mammogram for malignant neoplasm of breast: Secondary | ICD-10-CM

## 2019-07-15 ENCOUNTER — Other Ambulatory Visit: Payer: Self-pay

## 2019-07-15 ENCOUNTER — Ambulatory Visit (HOSPITAL_COMMUNITY)
Admission: RE | Admit: 2019-07-15 | Discharge: 2019-07-15 | Disposition: A | Discharge status: Home / Self Care | Payer: Commercial Managed Care - PPO | Source: Ambulatory Visit | Attending: Family Medicine | Admitting: Family Medicine

## 2019-07-15 DIAGNOSIS — Z1231 Encounter for screening mammogram for malignant neoplasm of breast: Secondary | ICD-10-CM | POA: Diagnosis present

## 2019-08-15 ENCOUNTER — Ambulatory Visit: Payer: Commercial Managed Care - PPO | Admitting: Family Medicine

## 2020-06-20 ENCOUNTER — Other Ambulatory Visit (HOSPITAL_COMMUNITY): Payer: Self-pay | Admitting: Internal Medicine

## 2020-06-20 DIAGNOSIS — Z1231 Encounter for screening mammogram for malignant neoplasm of breast: Secondary | ICD-10-CM

## 2020-07-16 ENCOUNTER — Other Ambulatory Visit: Payer: Self-pay

## 2020-07-16 ENCOUNTER — Ambulatory Visit (HOSPITAL_COMMUNITY)
Admission: RE | Admit: 2020-07-16 | Discharge: 2020-07-16 | Disposition: A | Discharge status: Home / Self Care | Payer: Commercial Managed Care - PPO | Source: Ambulatory Visit | Attending: Internal Medicine | Admitting: Internal Medicine

## 2020-07-16 DIAGNOSIS — Z1231 Encounter for screening mammogram for malignant neoplasm of breast: Secondary | ICD-10-CM

## 2021-06-18 ENCOUNTER — Other Ambulatory Visit (HOSPITAL_COMMUNITY): Payer: Self-pay | Admitting: Internal Medicine

## 2021-06-18 DIAGNOSIS — Z1231 Encounter for screening mammogram for malignant neoplasm of breast: Secondary | ICD-10-CM

## 2021-07-18 ENCOUNTER — Other Ambulatory Visit: Payer: Self-pay

## 2021-07-18 ENCOUNTER — Ambulatory Visit (HOSPITAL_COMMUNITY)
Admission: RE | Admit: 2021-07-18 | Discharge: 2021-07-18 | Disposition: A | Discharge status: Home / Self Care | Payer: Medicare HMO | Source: Ambulatory Visit | Attending: Internal Medicine | Admitting: Internal Medicine

## 2021-07-18 DIAGNOSIS — Z1231 Encounter for screening mammogram for malignant neoplasm of breast: Secondary | ICD-10-CM | POA: Insufficient documentation

## 2021-12-07 ENCOUNTER — Other Ambulatory Visit: Payer: Self-pay

## 2021-12-07 ENCOUNTER — Encounter (HOSPITAL_COMMUNITY): Payer: Self-pay | Admitting: *Deleted

## 2021-12-07 ENCOUNTER — Emergency Department (HOSPITAL_COMMUNITY)
Admission: EM | Admit: 2021-12-07 | Discharge: 2021-12-07 | Disposition: A | Discharge status: Home / Self Care | Payer: Medicare HMO | Attending: Emergency Medicine | Admitting: Emergency Medicine

## 2021-12-07 DIAGNOSIS — H7392 Unspecified disorder of tympanic membrane, left ear: Secondary | ICD-10-CM | POA: Diagnosis not present

## 2021-12-07 DIAGNOSIS — H9212 Otorrhea, left ear: Secondary | ICD-10-CM | POA: Diagnosis present

## 2021-12-07 DIAGNOSIS — H7292 Unspecified perforation of tympanic membrane, left ear: Secondary | ICD-10-CM

## 2021-12-07 MED ORDER — AMOXICILLIN-POT CLAVULANATE 875-125 MG PO TABS
1.0000 | ORAL_TABLET | Freq: Two times a day (BID) | ORAL | 0 refills | Status: AC
Start: 2021-12-07 — End: 2021-12-17

## 2021-12-07 NOTE — ED Triage Notes (Signed)
Pt states she had some sinus issues last week with sore throat and cough.  This am pt started having left ear pain with clear drainage

## 2021-12-07 NOTE — Discharge Instructions (Signed)
Do not get your ear wet.  Use earplugs if needing to be around water such as bath or shower.  If you develop fever, new or worsening headache, vomiting, or any other new/concerning symptoms then return to the ER for evaluation.

## 2021-12-07 NOTE — ED Provider Notes (Signed)
[facility] EMERGENCY DEPARTMENT Provider Note   CSN: [id number] Arrival date & time: 12/07/21  [id number]     History  Chief Complaint  Patient presents with   Ear Drainage    Madison Neal is a 66 y.o. female.  HPI 66 year old female presents with fluid coming out of her left ear and ear pain.  Started this morning.  Both ears started hurting this morning, left worse than right.  This morning she is noticing some clear and sometimes orangeish fluid coming out of the left ear.  Some decreased hearing out of that ear.  She has been dealing with headache, sore throat, cough and congestion for about 1 week.  No fevers or shortness of breath.  No ear trauma.  Home Medications Prior to Admission medications   Medication Sig Start Date End Date Taking? Authorizing Provider  azithromycin (ZITHROMAX) 250 MG tablet  09/11/17   [provider]  Calcium-Vitamin D (CALTRATE 600 PLUS-VIT D PO) Take by mouth daily.    [provider]  cephALEXin (KEFLEX) 500 MG capsule Take 1 capsule (500 mg total) by mouth 2 (two) times daily. 06/03/17   Park Liter, DPM  Cholecalciferol (VITAMIN D-3) 5000 UNITS TABS Take by mouth daily.    [provider]  ciprofloxacin (CIPRO) 250 MG tablet Take 1 tablet (250 mg total) by mouth 2 (two) times daily. 06/17/17   Park Liter, DPM  doxycycline (VIBRA-TABS) 100 MG tablet Take 1 tablet (100 mg total) by mouth 2 (two) times daily. 06/10/17   Park Liter, DPM  KRILL OIL OMEGA-3 PO Take by mouth daily.    [provider]  ondansetron (ZOFRAN) 4 MG tablet Take 1 tablet (4 mg total) by mouth every 8 (eight) hours as needed for nausea or vomiting. 06/03/17   Park Liter, DPM  oxyCODONE-acetaminophen (PERCOCET) 10-325 MG tablet Take 1 tablet by mouth every 4 (four) hours as needed for pain. 06/03/17   Park Liter, DPM  Red Yeast Rice Extract (RED YEAST RICE PO) Take by mouth daily.    [provider]  simvastatin  (ZOCOR) 20 MG tablet Take 20 mg by mouth daily.    [provider]      Allergies    Patient has no known allergies.    Review of Systems   Review of Systems  Constitutional:  Negative for fever.  HENT:  Positive for congestion, ear discharge, ear pain and sore throat.   Respiratory:  Positive for cough. Negative for shortness of breath.   Neurological:  Positive for headaches.    Physical Exam Updated Vital Signs BP 121/78 (BP Location: Left Arm)   Pulse 99   Temp 99.1 F (37.3 C) (Oral)   Resp 18   Ht 5' (1.524 m)   Wt 54.4 kg   SpO2 99%   BMI 23.44 kg/m  Physical Exam Vitals and nursing note reviewed.  Constitutional:      Appearance: She is well-developed.  HENT:     Head: Normocephalic and atraumatic.     Left Ear: Tympanic membrane is perforated.     Ears:     Comments: Right ear canal is occluded by cerumen.  Mild amber drainage noted in the left ear canal Cardiovascular:     Rate and Rhythm: Normal rate and regular rhythm.     Heart sounds: Normal heart sounds.  Pulmonary:     Effort: Pulmonary effort is normal.     Breath sounds: Normal breath sounds.  No wheezing, rhonchi or rales.  Abdominal:     General: There is no distension.  Skin:    General: Skin is warm and dry.  Neurological:     Mental Status: She is alert.     Comments: Cranial nerves II-XII grossly intact     ED Results / Procedures / Treatments   Labs (all labs ordered are listed, but only abnormal results are displayed) Labs Reviewed - No data to display  EKG None  Radiology No results found.  Procedures Procedures    Medications Ordered in ED Medications - No data to display  ED Course/ Medical Decision Making/ A&P                           Medical Decision Making  Exam is consistent with ruptured TM on the left side.  I cannot see the TM on the right, though her symptoms are minimal.  Offered for cleaning her cerumen but she declines.  Overall her presentation  sounds like she has a sinus infection, probably viral but with the TM perforation I think would be best to cover her with antibiotics.  It is fairly sizable.  Otherwise, no obvious cranial nerve deficits and I doubt acute CNS emergency.  No shortness of breath and has clear lungs I do not think x-ray is needed and have low suspicion for pneumonia.  Will refer to ENT.  Discussed not getting water in her ear and she was given return precautions.        Final Clinical Impression(s) / ED Diagnoses Final diagnoses:  Ruptured tympanic membrane, left    Rx / DC Orders ED Discharge Orders     None         Pricilla Loveless, MD 12/07/21 [phone]

## 2022-06-10 ENCOUNTER — Other Ambulatory Visit (HOSPITAL_COMMUNITY): Payer: Self-pay | Admitting: Internal Medicine

## 2022-06-10 DIAGNOSIS — Z1231 Encounter for screening mammogram for malignant neoplasm of breast: Secondary | ICD-10-CM

## 2022-07-21 ENCOUNTER — Encounter (HOSPITAL_COMMUNITY): Payer: Self-pay

## 2022-07-21 ENCOUNTER — Ambulatory Visit (HOSPITAL_COMMUNITY)
Admission: RE | Admit: 2022-07-21 | Discharge: 2022-07-21 | Disposition: A | Discharge status: Home / Self Care | Payer: Medicare HMO | Source: Ambulatory Visit | Attending: Internal Medicine | Admitting: Internal Medicine

## 2022-07-21 DIAGNOSIS — Z1231 Encounter for screening mammogram for malignant neoplasm of breast: Secondary | ICD-10-CM | POA: Insufficient documentation

## 2022-09-21 IMAGING — MG MM DIGITAL SCREENING BILAT W/ TOMO AND CAD
6 of 10 series · 6 of 30 positions shown · non-contrast
Comparison: Previous exam(s).

CLINICAL DATA: Screening.

EXAM:
DIGITAL SCREENING BILATERAL MAMMOGRAM WITH TOMOSYNTHESIS AND CAD
TECHNIQUE: Bilateral screening digital craniocaudal and mediolateral oblique
mammograms were obtained. Bilateral screening digital breast
tomosynthesis was performed. The images were evaluated with
computer-aided detection.

[R MLO synth-2D (1 of 2)]
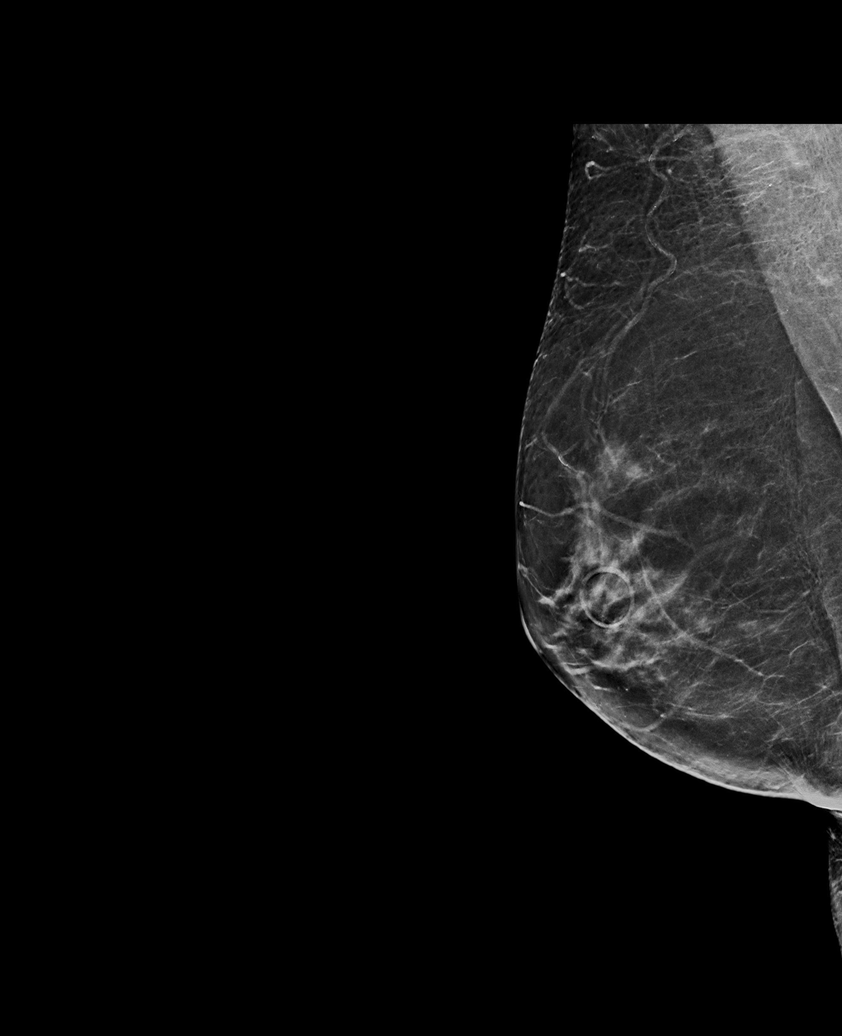

[L CC synth-2D]
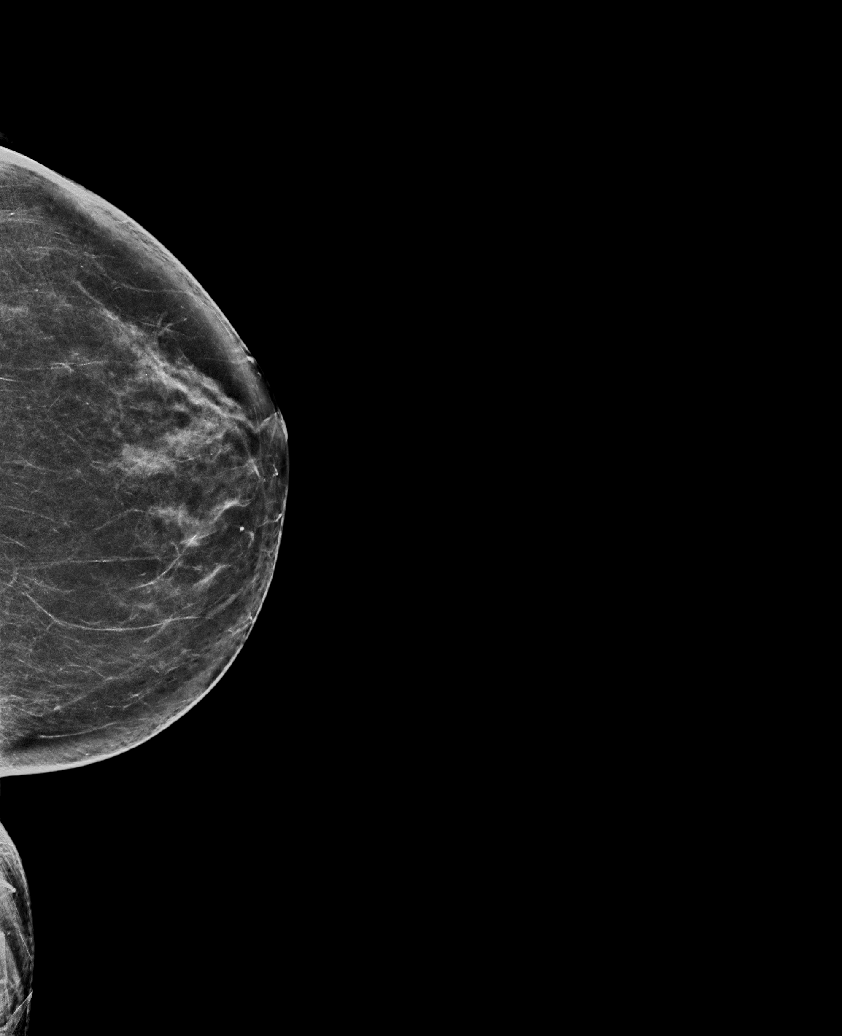

[L MLO synth-2D]
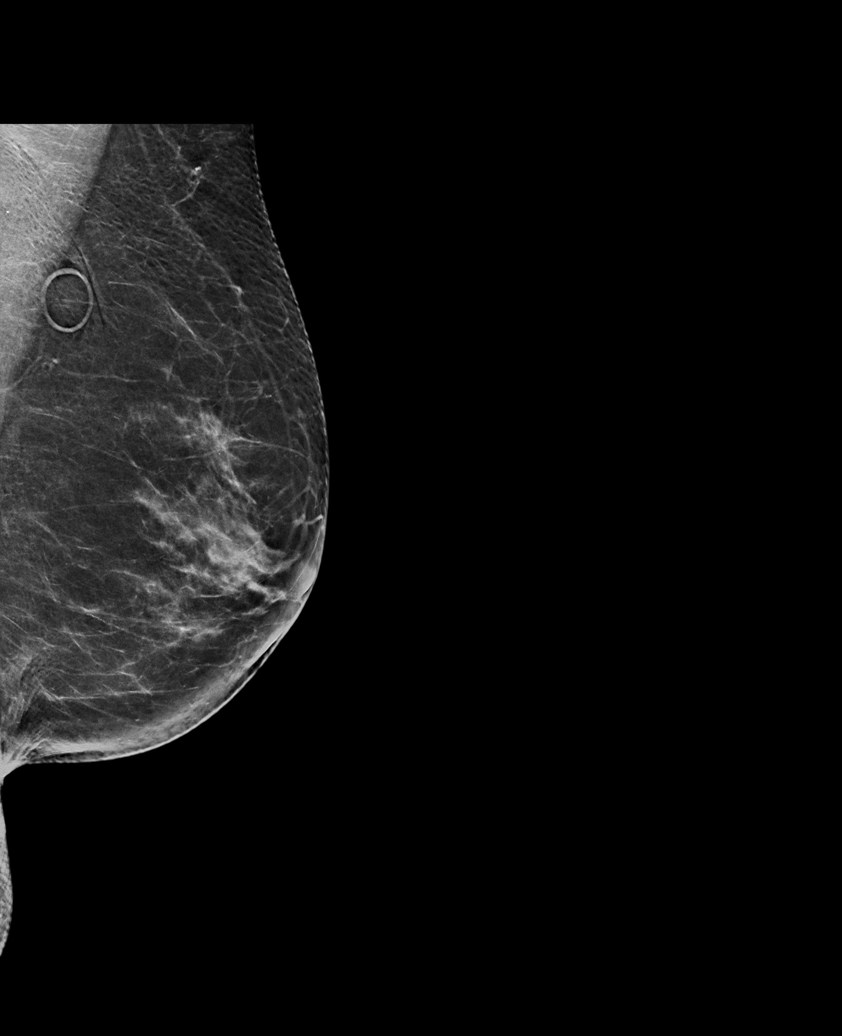

[R MLO synth-2D (2 of 2)]
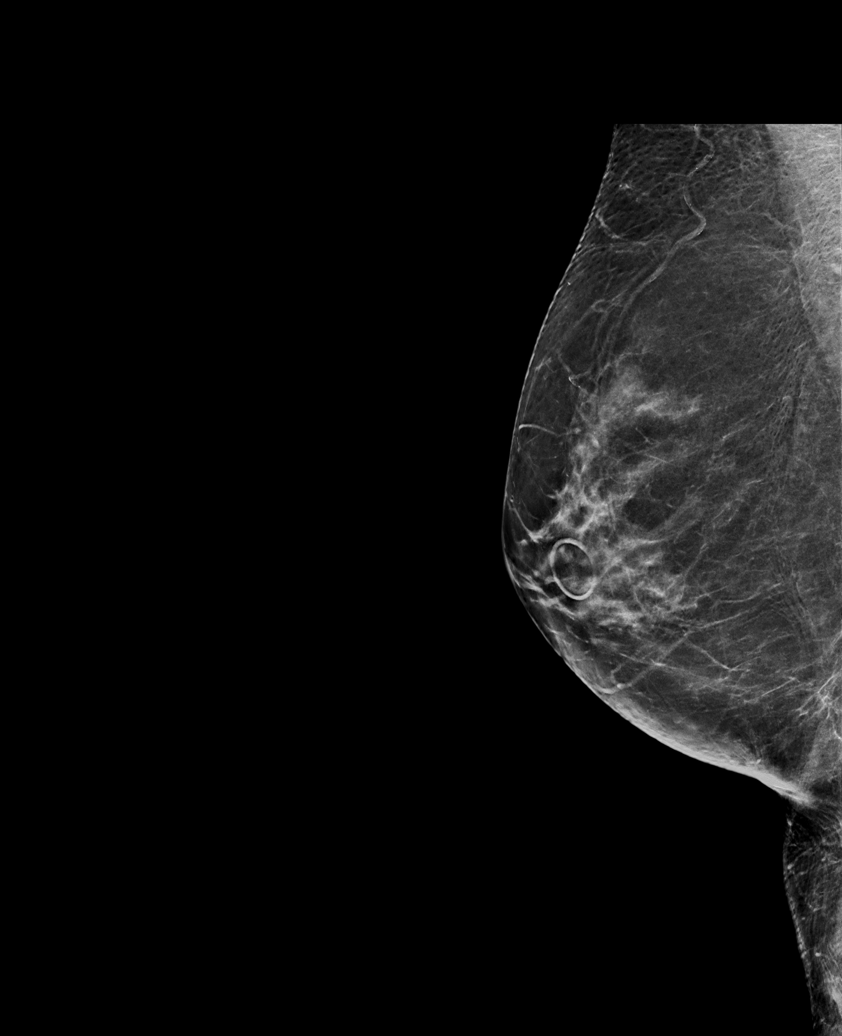

[R CC synth-2D]
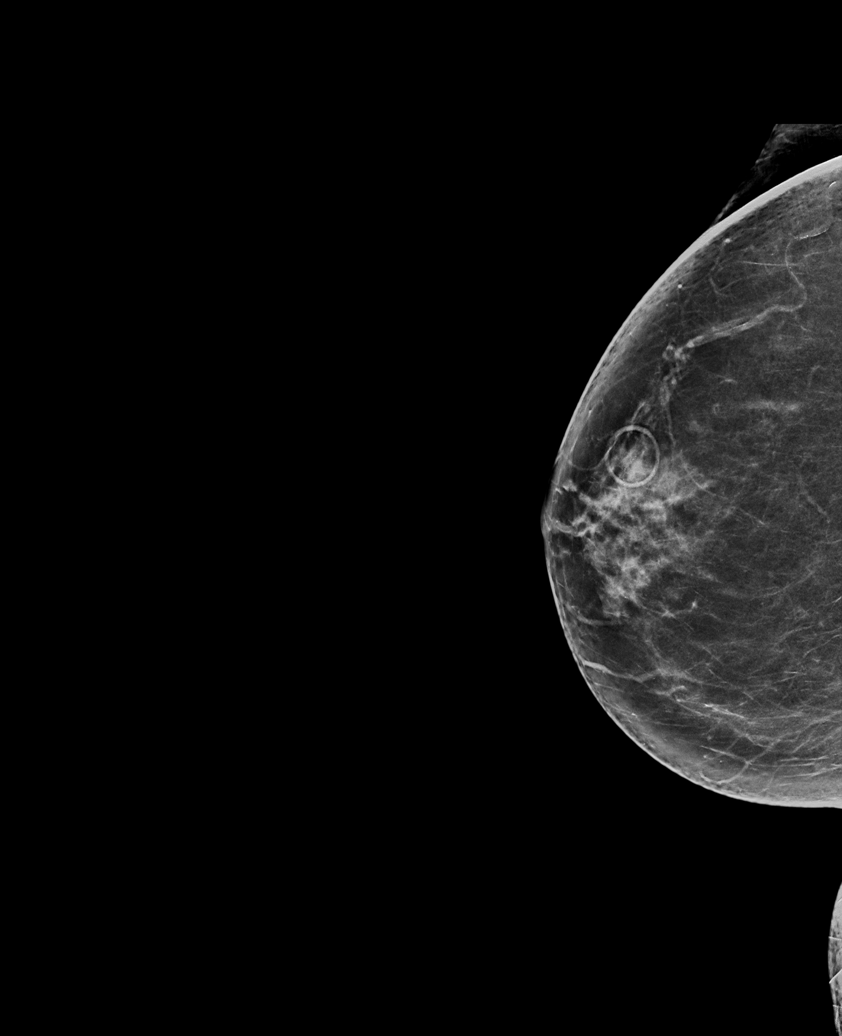

[L MLO tomo · tomo slice 35/70.0]
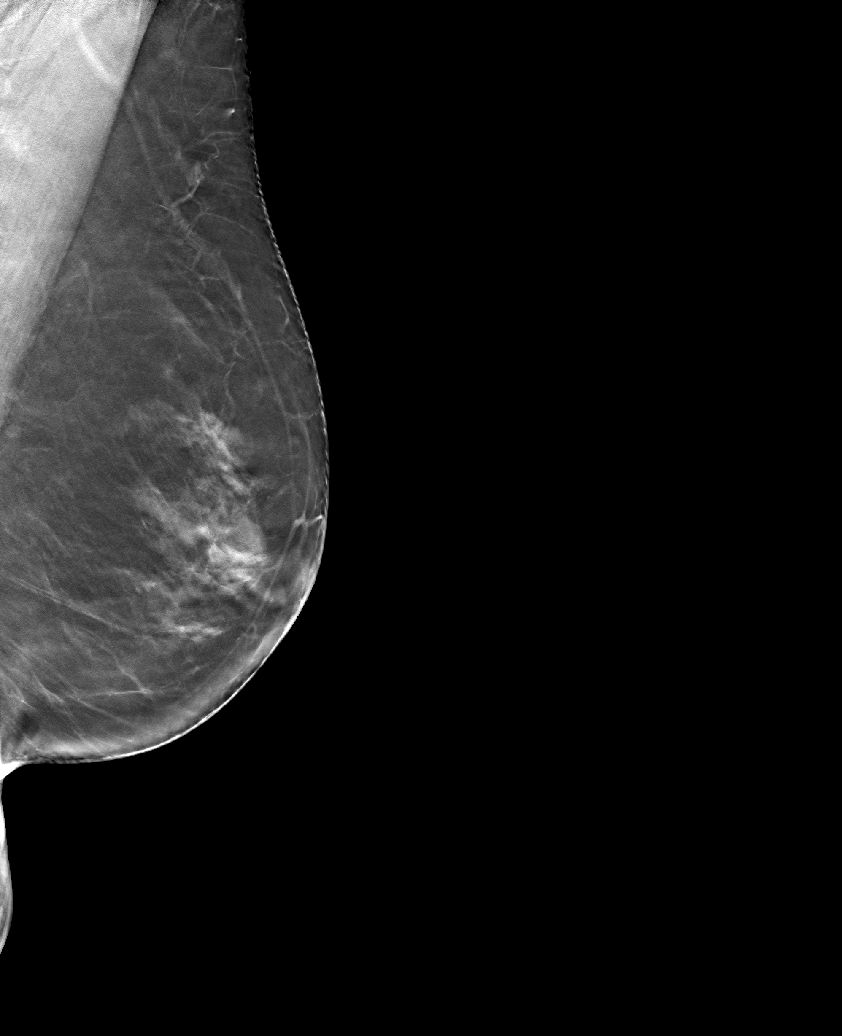

[6 of 30 positions shown; findings below may reference images not displayed]

ACR Breast Density Category b: There are scattered areas of
fibroglandular density.
FINDINGS: There are no findings suspicious for malignancy. The images were
evaluated with computer-aided detection.
IMPRESSION: No mammographic evidence of malignancy. A result letter of this
screening mammogram will be mailed directly to the patient.

RECOMMENDATION:
Screening mammogram in one year. (Code:WJ-I-BG6)

BI-RADS CATEGORY  1: Negative.

## 2023-04-22 ENCOUNTER — Ambulatory Visit (INDEPENDENT_AMBULATORY_CARE_PROVIDER_SITE_OTHER): Payer: Medicare HMO | Admitting: Internal Medicine

## 2023-04-22 ENCOUNTER — Encounter: Payer: Self-pay | Admitting: *Deleted

## 2023-04-22 ENCOUNTER — Encounter: Payer: Self-pay | Admitting: Internal Medicine

## 2023-04-22 VITALS — BP 109/76 | HR 88 | Temp 97.8°F | Ht 60.0 in | Wt 127.7 lb

## 2023-04-22 DIAGNOSIS — R1110 Vomiting, unspecified: Secondary | ICD-10-CM

## 2023-04-22 DIAGNOSIS — R1319 Other dysphagia: Secondary | ICD-10-CM

## 2023-04-22 DIAGNOSIS — R1310 Dysphagia, unspecified: Secondary | ICD-10-CM

## 2023-04-22 NOTE — H&P (View-Only) (Signed)
 Primary Care Physician:  Leone Payor, FNP Primary Gastroenterologist:  Dr. Marletta Lor  Chief Complaint  Patient presents with   Dysphagia    Patient here today as a new patient due to issues with dysphagia. Patient says she has a feeling of something in her throat. Patient denies any history of gerd or reflux.     HPI:   Madison Neal is a 67 y.o. female who presents to the clinic today by referral from her PCP Leone Payor for evaluation.  Patient states she has had progressively worsening esophageal dysphagia.  Has noted 4 episodes over the last 6 months where food will get stuck in her substernal region.  Notes meat like steak or chicken tend to cause of most issues though had a particular issue 1 week ago with granola bar.  This will lead to either regurgitation of said food or she will suffer for hours until it resolves.  During this timeframe she cannot tolerate saliva or water without regurgitation.  Denies any heartburn or acid reflux.  No epigastric or chest pain.  No previous upper endoscopy.  Denies any NSAID use.  From a colon cancer screening standpoint, reportedly negative Cologuard 2023.  No family history of colorectal malignancy.  No melena hematochezia.  No abdominal pain.  No unintentional weight loss.  Bowels moving well.  Past Medical History:  Diagnosis Date   Dyslipidemia    Vitamin D deficiency     Past Surgical History:  Procedure Laterality Date   ABDOMINAL HYSTERECTOMY  1991   radial tunnel Right     Current Outpatient Medications  Medication Sig Dispense Refill   Cholecalciferol (VITAMIN D-3) 5000 UNITS TABS Take by mouth daily.     loratadine (CLARITIN) 10 MG tablet Take 10 mg by mouth daily.     simvastatin (ZOCOR) 20 MG tablet Take 20 mg by mouth daily.     No current facility-administered medications for this visit.    Allergies as of 04/22/2023   (No Known Allergies)    Family History  Problem Relation Age of Onset   Diabetes Mother      Social History   Socioeconomic History   Marital status: Married    Spouse name: Not on file   Number of children: Not on file   Years of education: Not on file   Highest education level: Not on file  Occupational History   Not on file  Tobacco Use   Smoking status: Never   Smokeless tobacco: Never  Vaping Use   Vaping status: Never Used  Substance and Sexual Activity   Alcohol use: No   Drug use: No   Sexual activity: Yes  Other Topics Concern   Not on file  Social History Narrative   Not on file   Social Determinants of Health   Financial Resource Strain: Not on file  Food Insecurity: Not on file  Transportation Needs: Not on file  Physical Activity: Not on file  Stress: Not on file  Social Connections: Not on file  Intimate Partner Violence: Not on file    Subjective: Review of Systems  Constitutional:  Negative for chills and fever.  HENT:  Negative for congestion and hearing loss.   Eyes:  Negative for blurred vision and double vision.  Respiratory:  Negative for cough and shortness of breath.   Cardiovascular:  Negative for chest pain and palpitations.  Gastrointestinal:  Negative for abdominal pain, blood in stool, constipation, diarrhea, heartburn, melena and vomiting.  Dysphagia  Genitourinary:  Negative for dysuria and urgency.  Musculoskeletal:  Negative for joint pain and myalgias.  Skin:  Negative for itching and rash.  Neurological:  Negative for dizziness and headaches.  Psychiatric/Behavioral:  Negative for depression. The patient is not nervous/anxious.        Objective: BP 109/76 (BP Location: Left Arm, Patient Position: Sitting, Cuff Size: Normal)   Pulse 88   Temp 97.8 F (36.6 C) (Temporal)   Ht 5' (1.524 m)   Wt 127 lb 11.2 oz (57.9 kg)   BMI 24.94 kg/m  Physical Exam Constitutional:      Appearance: Normal appearance.  HENT:     Head: Normocephalic and atraumatic.  Eyes:     Extraocular Movements: Extraocular  movements intact.     Conjunctiva/sclera: Conjunctivae normal.  Cardiovascular:     Rate and Rhythm: Normal rate and regular rhythm.  Pulmonary:     Effort: Pulmonary effort is normal.     Breath sounds: Normal breath sounds.  Abdominal:     General: Bowel sounds are normal.     Palpations: Abdomen is soft.  Musculoskeletal:        General: No swelling. Normal range of motion.     Cervical back: Normal range of motion and neck supple.  Skin:    General: Skin is warm and dry.     Coloration: Skin is not jaundiced.  Neurological:     General: No focal deficit present.     Mental Status: She is alert and oriented to person, place, and time.  Psychiatric:        Mood and Affect: Mood normal.        Behavior: Behavior normal.      Assessment: *Esophageal dysphagia *Food regurgitation  Plan: Will schedule for EGD with possible dilation to evaluate for peptic ulcer disease, esophagitis, gastritis, H. Pylori, duodenitis, or other. Will also evaluate for esophageal stricture, Schatzki's ring, esophageal web or other.   The risks including infection, bleed, or perforation as well as benefits, limitations, alternatives and imponderables have been reviewed with the patient. Potential for esophageal dilation, biopsy, etc. have also been reviewed.  Questions have been answered. All parties agreeable.  Patient to avoid tough textures. All meats should be chopped finely. Eat slowly, take small bites, chew thoroughly, and drink plenty of liquids throughout meals. Advised if something were to get hung in patient's esophagus and not come up or go down, they should proceed to the emergency room.   Thank you Leone Payor for the kind referral.  04/22/2023 1:22 PM   Disclaimer: This note was dictated with voice recognition software. Similar sounding words can inadvertently be transcribed and may not be corrected upon review.

## 2023-04-22 NOTE — Progress Notes (Signed)
Primary Care Physician:  Leone Payor, FNP Primary Gastroenterologist:  Dr. Marletta Lor  Chief Complaint  Patient presents with   Dysphagia    Patient here today as a new patient due to issues with dysphagia. Patient says she has a feeling of something in her throat. Patient denies any history of gerd or reflux.     HPI:   Madison Neal is a 67 y.o. female who presents to the clinic today by referral from her PCP Leone Payor for evaluation.  Patient states she has had progressively worsening esophageal dysphagia.  Has noted 4 episodes over the last 6 months where food will get stuck in her substernal region.  Notes meat like steak or chicken tend to cause of most issues though had a particular issue 1 week ago with granola bar.  This will lead to either regurgitation of said food or she will suffer for hours until it resolves.  During this timeframe she cannot tolerate saliva or water without regurgitation.  Denies any heartburn or acid reflux.  No epigastric or chest pain.  No previous upper endoscopy.  Denies any NSAID use.  From a colon cancer screening standpoint, reportedly negative Cologuard 2023.  No family history of colorectal malignancy.  No melena hematochezia.  No abdominal pain.  No unintentional weight loss.  Bowels moving well.  Past Medical History:  Diagnosis Date   Dyslipidemia    Vitamin D deficiency     Past Surgical History:  Procedure Laterality Date   ABDOMINAL HYSTERECTOMY  1991   radial tunnel Right     Current Outpatient Medications  Medication Sig Dispense Refill   Cholecalciferol (VITAMIN D-3) 5000 UNITS TABS Take by mouth daily.     loratadine (CLARITIN) 10 MG tablet Take 10 mg by mouth daily.     simvastatin (ZOCOR) 20 MG tablet Take 20 mg by mouth daily.     No current facility-administered medications for this visit.    Allergies as of 04/22/2023   (No Known Allergies)    Family History  Problem Relation Age of Onset   Diabetes Mother      Social History   Socioeconomic History   Marital status: Married    Spouse name: Not on file   Number of children: Not on file   Years of education: Not on file   Highest education level: Not on file  Occupational History   Not on file  Tobacco Use   Smoking status: Never   Smokeless tobacco: Never  Vaping Use   Vaping status: Never Used  Substance and Sexual Activity   Alcohol use: No   Drug use: No   Sexual activity: Yes  Other Topics Concern   Not on file  Social History Narrative   Not on file   Social Determinants of Health   Financial Resource Strain: Not on file  Food Insecurity: Not on file  Transportation Needs: Not on file  Physical Activity: Not on file  Stress: Not on file  Social Connections: Not on file  Intimate Partner Violence: Not on file    Subjective: Review of Systems  Constitutional:  Negative for chills and fever.  HENT:  Negative for congestion and hearing loss.   Eyes:  Negative for blurred vision and double vision.  Respiratory:  Negative for cough and shortness of breath.   Cardiovascular:  Negative for chest pain and palpitations.  Gastrointestinal:  Negative for abdominal pain, blood in stool, constipation, diarrhea, heartburn, melena and vomiting.  Dysphagia  Genitourinary:  Negative for dysuria and urgency.  Musculoskeletal:  Negative for joint pain and myalgias.  Skin:  Negative for itching and rash.  Neurological:  Negative for dizziness and headaches.  Psychiatric/Behavioral:  Negative for depression. The patient is not nervous/anxious.        Objective: BP 109/76 (BP Location: Left Arm, Patient Position: Sitting, Cuff Size: Normal)   Pulse 88   Temp 97.8 F (36.6 C) (Temporal)   Ht 5' (1.524 m)   Wt 127 lb 11.2 oz (57.9 kg)   BMI 24.94 kg/m  Physical Exam Constitutional:      Appearance: Normal appearance.  HENT:     Head: Normocephalic and atraumatic.  Eyes:     Extraocular Movements: Extraocular  movements intact.     Conjunctiva/sclera: Conjunctivae normal.  Cardiovascular:     Rate and Rhythm: Normal rate and regular rhythm.  Pulmonary:     Effort: Pulmonary effort is normal.     Breath sounds: Normal breath sounds.  Abdominal:     General: Bowel sounds are normal.     Palpations: Abdomen is soft.  Musculoskeletal:        General: No swelling. Normal range of motion.     Cervical back: Normal range of motion and neck supple.  Skin:    General: Skin is warm and dry.     Coloration: Skin is not jaundiced.  Neurological:     General: No focal deficit present.     Mental Status: She is alert and oriented to person, place, and time.  Psychiatric:        Mood and Affect: Mood normal.        Behavior: Behavior normal.      Assessment: *Esophageal dysphagia *Food regurgitation  Plan: Will schedule for EGD with possible dilation to evaluate for peptic ulcer disease, esophagitis, gastritis, H. Pylori, duodenitis, or other. Will also evaluate for esophageal stricture, Schatzki's ring, esophageal web or other.   The risks including infection, bleed, or perforation as well as benefits, limitations, alternatives and imponderables have been reviewed with the patient. Potential for esophageal dilation, biopsy, etc. have also been reviewed.  Questions have been answered. All parties agreeable.  Patient to avoid tough textures. All meats should be chopped finely. Eat slowly, take small bites, chew thoroughly, and drink plenty of liquids throughout meals. Advised if something were to get hung in patient's esophagus and not come up or go down, they should proceed to the emergency room.   Thank you Leone Payor for the kind referral.  04/22/2023 1:22 PM   Disclaimer: This note was dictated with voice recognition software. Similar sounding words can inadvertently be transcribed and may not be corrected upon review.

## 2023-04-22 NOTE — Patient Instructions (Signed)
We will schedule you for upper endoscopy to further evaluate your symptoms.  I may elect to stretch your esophagus depending on findings.  It was very nice meeting you today.  Dr. Marletta Lor

## 2023-05-11 ENCOUNTER — Encounter (HOSPITAL_COMMUNITY): Payer: Self-pay

## 2023-05-14 ENCOUNTER — Encounter (HOSPITAL_COMMUNITY)
Admission: RE | Admit: 2023-05-14 | Discharge: 2023-05-14 | Disposition: A | Discharge status: Home / Self Care | Payer: Medicare HMO | Source: Ambulatory Visit | Attending: Internal Medicine | Admitting: Internal Medicine

## 2023-05-14 HISTORY — DX: Other specified postprocedural states: Z98.890

## 2023-05-18 ENCOUNTER — Ambulatory Visit (HOSPITAL_COMMUNITY): Payer: Medicare HMO | Admitting: Anesthesiology

## 2023-05-18 ENCOUNTER — Encounter (HOSPITAL_COMMUNITY)
Admission: RE | Disposition: A | Discharge status: Home / Self Care | Payer: Self-pay | Source: Home / Self Care | Attending: Internal Medicine

## 2023-05-18 ENCOUNTER — Encounter (HOSPITAL_COMMUNITY): Payer: Self-pay

## 2023-05-18 ENCOUNTER — Ambulatory Visit (HOSPITAL_COMMUNITY)
Admission: RE | Admit: 2023-05-18 | Discharge: 2023-05-18 | Disposition: A | Discharge status: Home / Self Care | Payer: Medicare HMO | Attending: Internal Medicine | Admitting: Internal Medicine

## 2023-05-18 DIAGNOSIS — R1314 Dysphagia, pharyngoesophageal phase: Secondary | ICD-10-CM | POA: Diagnosis present

## 2023-05-18 DIAGNOSIS — I10 Essential (primary) hypertension: Secondary | ICD-10-CM | POA: Diagnosis not present

## 2023-05-18 DIAGNOSIS — R1310 Dysphagia, unspecified: Secondary | ICD-10-CM | POA: Diagnosis not present

## 2023-05-18 DIAGNOSIS — K449 Diaphragmatic hernia without obstruction or gangrene: Secondary | ICD-10-CM | POA: Insufficient documentation

## 2023-05-18 DIAGNOSIS — K222 Esophageal obstruction: Secondary | ICD-10-CM | POA: Diagnosis not present

## 2023-05-18 HISTORY — PX: ESOPHAGOGASTRODUODENOSCOPY (EGD) WITH PROPOFOL: SHX5813

## 2023-05-18 HISTORY — PX: BALLOON DILATION: SHX5330

## 2023-05-18 SURGERY — ESOPHAGOGASTRODUODENOSCOPY (EGD) WITH PROPOFOL
Anesthesia: General

## 2023-05-18 MED ORDER — PANTOPRAZOLE SODIUM 40 MG PO TBEC: 40.0000 mg | DELAYED_RELEASE_TABLET | Freq: Every day | ORAL | 11 refills | Status: DC

## 2023-05-18 MED ORDER — LIDOCAINE HCL (CARDIAC) PF 100 MG/5ML IV SOSY
PREFILLED_SYRINGE | INTRAVENOUS | Status: DC | PRN
  Administered 2023-05-18: 20 mg via INTRAVENOUS

## 2023-05-18 MED ORDER — PROPOFOL 10 MG/ML IV BOLUS
INTRAVENOUS | Status: DC | PRN
  Administered 2023-05-18: 20 mg via INTRAVENOUS
  Administered 2023-05-18: 120 mg via INTRAVENOUS

## 2023-05-18 MED ORDER — LACTATED RINGERS IV SOLN: INTRAVENOUS | Status: DC

## 2023-05-18 NOTE — Transfer of Care (Signed)
Immediate Anesthesia Transfer of Care Note  Patient: Madison Neal  Procedure(s) Performed: ESOPHAGOGASTRODUODENOSCOPY (EGD) WITH PROPOFOL BALLOON DILATION  Patient Location: Short Stay  Anesthesia Type:General  Level of Consciousness: awake and patient cooperative  Airway & Oxygen Therapy: Patient Spontanous Breathing  Post-op Assessment: Report given to RN and Post -op Vital signs reviewed and stable  Post vital signs: Reviewed and stable  Last Vitals:  Vitals Value Taken Time  BP 86/75 05/18/23 0831  Temp 36.6 C 05/18/23 0827  Pulse 74 05/18/23 0831  Resp 20 05/18/23 0831  SpO2 100 % 05/18/23 0831    Last Pain:  Vitals:   05/18/23 0827  TempSrc: Axillary  PainSc:          Complications: No notable events documented.

## 2023-05-18 NOTE — Anesthesia Preprocedure Evaluation (Signed)
 Anesthesia Evaluation  Patient identified by MRN, date of birth, ID band Patient awake    Reviewed: Allergy & Precautions, H&P , NPO status , Patient's Chart, lab work & pertinent test results, reviewed documented beta blocker date and time   History of Anesthesia Complications (+) PONV and history of anesthetic complications  Airway Mallampati: II  TM Distance: >3 FB Neck ROM: full    Dental no notable dental hx.    Pulmonary neg pulmonary ROS   Pulmonary exam normal breath sounds clear to auscultation       Cardiovascular Exercise Tolerance: Good hypertension, negative cardio ROS  Rhythm:regular Rate:Normal     Neuro/Psych negative neurological ROS  negative psych ROS   GI/Hepatic negative GI ROS, Neg liver ROS,,,  Endo/Other  negative endocrine ROS    Renal/GU negative Renal ROS  negative genitourinary   Musculoskeletal   Abdominal   Peds  Hematology negative hematology ROS (+)   Anesthesia Other Findings   Reproductive/Obstetrics negative OB ROS                             Anesthesia Physical Anesthesia Plan  ASA: 2  Anesthesia Plan: General   Post-op Pain Management:    Induction:   PONV Risk Score and Plan: Propofol infusion  Airway Management Planned:   Additional Equipment:   Intra-op Plan:   Post-operative Plan:   Informed Consent: I have reviewed the patients History and Physical, chart, labs and discussed the procedure including the risks, benefits and alternatives for the proposed anesthesia with the patient or authorized representative who has indicated his/her understanding and acceptance.     Dental Advisory Given  Plan Discussed with: CRNA  Anesthesia Plan Comments:        Anesthesia Quick Evaluation

## 2023-05-18 NOTE — Anesthesia Procedure Notes (Signed)
Date/Time: 05/18/2023 8:14 AM  Performed by: Franco Nones, CRNAPre-anesthesia Checklist: Patient identified, Emergency Drugs available, Suction available, Timeout performed and Patient being monitored Patient Re-evaluated:Patient Re-evaluated prior to induction Oxygen Delivery Method: Nasal Cannula

## 2023-05-18 NOTE — Interval H&P Note (Signed)
History and Physical Interval Note:  05/18/2023 7:36 AM  Madison Neal  has presented today for surgery, with the diagnosis of DYSPHAGIA, REGURGITATION FOOD.  The various methods of treatment have been discussed with the patient and family. After consideration of risks, benefits and other options for treatment, the patient has consented to  Procedure(s) with comments: ESOPHAGOGASTRODUODENOSCOPY (EGD) WITH PROPOFOL (N/A) - 815am, asa 2 BALLOON DILATION (N/A) as a surgical intervention.  The patient's history has been reviewed, patient examined, no change in status, stable for surgery.  I have reviewed the patient's chart and labs.  Questions were answered to the patient's satisfaction.     Lanelle Bal

## 2023-05-18 NOTE — Op Note (Signed)
[facility] Patient Name: Madison Neal Procedure Date: 05/18/2023 8:04 AM MRN: [id number] Date of Birth: 12/04/1955 Attending MD: Hennie Duos. Marletta Lor , [place], [id number] CSN: [id number] Age: 67 Admit Type: Outpatient Procedure:                Upper GI endoscopy Indications:              Dysphagia Providers:                Hennie Duos. Marletta Lor, DO, Emilee Tubb RN, RN, Lafonda Mosses, Technician Referring MD:              Medicines:                See the Anesthesia note for documentation of the                            administered medications Complications:            No immediate complications. Estimated Blood Loss:     Estimated blood loss was minimal. Procedure:                Pre-Anesthesia Assessment:                           - The anesthesia plan was to use monitored                            anesthesia care (MAC).                           After obtaining informed consent, the endoscope was                            passed under direct vision. Throughout the                            procedure, the patient's blood pressure, pulse, and                            oxygen saturations were monitored continuously. The                            GIF-H190 ([id number]) scope was introduced through the                            mouth, and advanced to the second part of duodenum.                            The upper GI endoscopy was accomplished without                            difficulty. The patient tolerated the procedure                            well. Scope In:  8:19:17 AM Scope Out: 8:23:24 AM Total Procedure Duration: 0 hours 4 minutes 7 seconds  Findings:      A small hiatal hernia was present.      A moderate Schatzki ring was found in the distal esophagus. A TTS       dilator was passed through the scope. Dilation with a 15-16.5-18 mm       balloon dilator was performed to 15 mm. The dilation site was examined       and showed mild  mucosal disruption and moderate improvement in luminal       narrowing.      The entire examined stomach was normal.      The duodenal bulb, first portion of the duodenum and second portion of       the duodenum were normal. Impression:               - Small hiatal hernia.                           - Moderate Schatzki ring. Dilated.                           - Normal stomach.                           - Normal duodenal bulb, first portion of the                            duodenum and second portion of the duodenum.                           - No specimens collected. Moderate Sedation:      Per Anesthesia Care Recommendation:           - Patient has a contact number available for                            emergencies. The signs and symptoms of potential                            delayed complications were discussed with the                            patient. Return to normal activities tomorrow.                            Written discharge instructions were provided to the                            patient.                           - Resume previous diet.                           - Continue present medications.                           - Await pathology results.                           -  Await pathology results.                           - Repeat upper endoscopy PRN for retreatment.                           - Return to GI clinic in 3 months.                           - Use Protonix (pantoprazole) 40 mg PO daily. Procedure Code(s):        --- Professional ---                           [phone], Esophagogastroduodenoscopy, flexible,                            transoral; with transendoscopic balloon dilation of                            esophagus (less than 30 mm diameter) Diagnosis Code(s):        --- Professional ---                           K44.9, Diaphragmatic hernia without obstruction or                            gangrene                           K22.2, Esophageal  obstruction                           R13.10, Dysphagia, unspecified CPT copyright 2022 American Medical Association. All rights reserved. The codes documented in this report are preliminary and upon coder review may  be revised to meet current compliance requirements. Hennie Duos. Marletta Lor, DO Hennie Duos. Marletta Lor, DO 05/18/2023 8:25:52 AM This report has been signed electronically. Number of Addenda: 0

## 2023-05-18 NOTE — Discharge Instructions (Signed)
EGD Discharge instructions Please read the instructions outlined below and refer to this sheet in the next few weeks. These discharge instructions provide you with general information on caring for yourself after you leave the hospital. Your doctor may also give you specific instructions. While your treatment has been planned according to the most current medical practices available, unavoidable complications occasionally occur. If you have any problems or questions after discharge, please call your doctor. ACTIVITY You may resume your regular activity but move at a slower pace for the next 24 hours.  Take frequent rest periods for the next 24 hours.  Walking will help expel (get rid of) the air and reduce the bloated feeling in your abdomen.  No driving for 24 hours (because of the anesthesia (medicine) used during the test).  You may shower.  Do not sign any important legal documents or operate any machinery for 24 hours (because of the anesthesia used during the test).  NUTRITION Drink plenty of fluids.  You may resume your normal diet.  Begin with a light meal and progress to your normal diet.  Avoid alcoholic beverages for 24 hours or as instructed by your caregiver.  MEDICATIONS You may resume your normal medications unless your caregiver tells you otherwise.  WHAT YOU CAN EXPECT TODAY You may experience abdominal discomfort such as a feeling of fullness or "gas" pains.  FOLLOW-UP Your doctor will discuss the results of your test with you.  SEEK IMMEDIATE MEDICAL ATTENTION IF ANY OF THE FOLLOWING OCCUR: Excessive nausea (feeling sick to your stomach) and/or vomiting.  Severe abdominal pain and distention (swelling).  Trouble swallowing.  Temperature over 101 F (37.8 C).  Rectal bleeding or vomiting of blood.    Your upper endoscopy revealed a significant tightening called of your esophagus called a Schatzki's ring.  I stretched this out today.  Hopefully this helps with feeling  of food getting stuck.  We may need to perform further dilation in the future.  Also noted small hiatal hernia.  Stomach and small bowel normal.  I am going to start you on a new medication called pantoprazole 40 mg daily.  This medication works best if you take it at least 30 minutes for breakfast.  Follow-up in 3 months.  I hope you have a great rest of your week!  Hennie Duos. Marletta Lor, D.O. Gastroenterology and Hepatology [facility]

## 2023-05-19 NOTE — Anesthesia Postprocedure Evaluation (Signed)
 Anesthesia Post Note  Patient: Kriss Perleberg Braxton  Procedure(s) Performed: ESOPHAGOGASTRODUODENOSCOPY (EGD) WITH PROPOFOL  BALLOON DILATION  Patient location during evaluation: Phase II Anesthesia Type: General Level of consciousness: awake Pain management: pain level controlled Vital Signs Assessment: post-procedure vital signs reviewed and stable Respiratory status: spontaneous breathing and respiratory function stable Cardiovascular status: blood pressure returned to baseline and stable Postop Assessment: no headache and no apparent nausea or vomiting Anesthetic complications: no Comments: Late entry   No notable events documented.   Last Vitals:  Vitals:   05/18/23 0834 05/18/23 0835  BP: (!) 86/60 (!) 108/53  Pulse:  74  Resp:  17  Temp:    SpO2: 96% 99%    Last Pain:  Vitals:   05/18/23 0827  TempSrc: Axillary  PainSc:                  Yvonna JINNY Bosworth

## 2023-05-29 ENCOUNTER — Encounter (HOSPITAL_COMMUNITY): Payer: Self-pay | Admitting: Internal Medicine

## 2023-06-16 ENCOUNTER — Other Ambulatory Visit (HOSPITAL_COMMUNITY): Payer: Self-pay | Admitting: Internal Medicine

## 2023-06-16 DIAGNOSIS — Z1231 Encounter for screening mammogram for malignant neoplasm of breast: Secondary | ICD-10-CM

## 2023-07-02 ENCOUNTER — Encounter: Payer: Self-pay | Admitting: Internal Medicine

## 2023-07-23 ENCOUNTER — Ambulatory Visit (HOSPITAL_COMMUNITY)
Admission: RE | Admit: 2023-07-23 | Discharge: 2023-07-23 | Disposition: A | Discharge status: Home / Self Care | Payer: Medicare HMO | Source: Ambulatory Visit | Attending: Internal Medicine | Admitting: Internal Medicine

## 2023-07-23 DIAGNOSIS — Z1231 Encounter for screening mammogram for malignant neoplasm of breast: Secondary | ICD-10-CM | POA: Insufficient documentation

## 2023-07-29 ENCOUNTER — Encounter: Payer: Self-pay | Admitting: Internal Medicine

## 2023-07-29 ENCOUNTER — Ambulatory Visit (INDEPENDENT_AMBULATORY_CARE_PROVIDER_SITE_OTHER): Payer: Medicare HMO | Admitting: Internal Medicine

## 2023-07-29 VITALS — BP 110/72 | HR 92 | Temp 97.8°F | Ht 60.0 in | Wt 126.8 lb

## 2023-07-29 DIAGNOSIS — K222 Esophageal obstruction: Secondary | ICD-10-CM

## 2023-07-29 DIAGNOSIS — R1110 Vomiting, unspecified: Secondary | ICD-10-CM

## 2023-07-29 DIAGNOSIS — R1310 Dysphagia, unspecified: Secondary | ICD-10-CM | POA: Diagnosis not present

## 2023-07-29 DIAGNOSIS — R1319 Other dysphagia: Secondary | ICD-10-CM

## 2023-07-29 NOTE — Patient Instructions (Addendum)
 I am happy to hear that you are doing well.  Continue on pantoprazole daily.  Recommend you avoid tough textures. All meats should be chopped finely. Eat slowly, take small bites, chew thoroughly, and drink plenty of liquids throughout meals. If something were to get hung in your esophagus and not come up or go down, you should proceed to the emergency room.  Let me know if you have resurgence of your symptoms and we can consider repeat upper endoscopy with further esophageal dilation.  It was very nice seeing you again today.  Follow-up in 1 year or sooner if needed.  Dr. Marletta Lor

## 2023-07-29 NOTE — Progress Notes (Signed)
 Primary Care Physician:  Leone Payor, FNP Primary Gastroenterologist:  Dr. Marletta Lor  Chief Complaint  Patient presents with   Follow-up    Follow up on esophageal dysphagia.     HPI:   Madison Neal is a 68 y.o. female who presents to the clinic today for follow up visit.   Esophageal dysphagia, food regurgitation: Initially seen 04/22/2023 as a new patient for worsening esophageal dysphagia.  Underwent upper endoscopy 05/18/2023 which showed small hiatal hernia, moderate Schatzki's ring dilated up to 15 mm with mucosal disruption.  Normal stomach and small bowel.  Started on pantoprazole 40 mg daily.  Today, states her symptoms have resolved.  Has not had an episode of food regurgitation since prior to her upper endoscopy.  Colon cancer screening: Reportedly negative Cologuard 2023.  No family history of colorectal malignancy.  No melena hematochezia.  No abdominal pain.  No unintentional weight loss.  Bowels moving well.  Past Medical History:  Diagnosis Date   Dyslipidemia    PONV (postoperative nausea and vomiting)    Vitamin D deficiency     Past Surgical History:  Procedure Laterality Date   ABDOMINAL HYSTERECTOMY  1991   BALLOON DILATION N/A 05/18/2023   Procedure: BALLOON DILATION;  Surgeon: Lanelle Bal, DO;  Location: [facility];  Service: Endoscopy;  Laterality: N/A;   ESOPHAGOGASTRODUODENOSCOPY (EGD) WITH PROPOFOL N/A 05/18/2023   Procedure: ESOPHAGOGASTRODUODENOSCOPY (EGD) WITH PROPOFOL;  Surgeon: Lanelle Bal, DO;  Location: [facility];  Service: Endoscopy;  Laterality: N/A;  815am, asa 2   radial tunnel Right     Current Outpatient Medications  Medication Sig Dispense Refill   Cholecalciferol (VITAMIN D-3) 5000 UNITS TABS Take by mouth daily.     loratadine (CLARITIN) 10 MG tablet Take 10 mg by mouth daily.     pantoprazole (PROTONIX) 40 MG tablet Take 1 tablet (40 mg total) by mouth daily. 30 tablet 11   simvastatin (ZOCOR) 20 MG  tablet Take 20 mg by mouth daily.     No current facility-administered medications for this visit.    Allergies as of 07/29/2023   (No Known Allergies)    Family History  Problem Relation Age of Onset   Diabetes Mother     Social History   Socioeconomic History   Marital status: Married    Spouse name: Not on file   Number of children: Not on file   Years of education: Not on file   Highest education level: Not on file  Occupational History   Not on file  Tobacco Use   Smoking status: Never   Smokeless tobacco: Never  Vaping Use   Vaping status: Never Used  Substance and Sexual Activity   Alcohol use: No   Drug use: No   Sexual activity: Yes  Other Topics Concern   Not on file  Social History Narrative   Not on file   Social Drivers of Health   Financial Resource Strain: Not on file  Food Insecurity: Not on file  Transportation Needs: Not on file  Physical Activity: Not on file  Stress: Not on file  Social Connections: Not on file  Intimate Partner Violence: Not on file    Subjective: Review of Systems  Constitutional:  Negative for chills and fever.  HENT:  Negative for congestion and hearing loss.   Eyes:  Negative for blurred vision and double vision.  Respiratory:  Negative for cough and shortness of breath.   Cardiovascular:  Negative for  chest pain and palpitations.  Gastrointestinal:  Negative for abdominal pain, blood in stool, constipation, diarrhea, heartburn, melena and vomiting.       Dysphagia  Genitourinary:  Negative for dysuria and urgency.  Musculoskeletal:  Negative for joint pain and myalgias.  Skin:  Negative for itching and rash.  Neurological:  Negative for dizziness and headaches.  Psychiatric/Behavioral:  Negative for depression. The patient is not nervous/anxious.        Objective: BP 110/72   Pulse 92   Temp 97.8 F (36.6 C)   Ht 5' (1.524 m)   Wt 126 lb 12.8 oz (57.5 kg)   BMI 24.76 kg/m  Physical  Exam Constitutional:      Appearance: Normal appearance.  HENT:     Head: Normocephalic and atraumatic.  Eyes:     Extraocular Movements: Extraocular movements intact.     Conjunctiva/sclera: Conjunctivae normal.  Cardiovascular:     Rate and Rhythm: Normal rate and regular rhythm.  Pulmonary:     Effort: Pulmonary effort is normal.     Breath sounds: Normal breath sounds.  Abdominal:     General: Bowel sounds are normal.     Palpations: Abdomen is soft.  Musculoskeletal:        General: No swelling. Normal range of motion.     Cervical back: Normal range of motion and neck supple.  Skin:    General: Skin is warm and dry.     Coloration: Skin is not jaundiced.  Neurological:     General: No focal deficit present.     Mental Status: She is alert and oriented to person, place, and time.  Psychiatric:        Mood and Affect: Mood normal.        Behavior: Behavior normal.      Assessment: *Esophageal dysphagia *Food regurgitation *Schatzki's ring  Plan: Patient symptoms improved after upper endoscopy with esophageal dilation.  She has not had an episode of food regurgitation since prior to her procedure.  Continue pantoprazole daily.  If she has resurgence of her symptoms, we can consider repeat upper endoscopy with further dilation.  Patient to avoid tough textures. All meats should be chopped finely. Eat slowly, take small bites, chew thoroughly, and drink plenty of liquids throughout meals. Advised if something were to get hung in patient's esophagus and not come up or go down, they should proceed to the emergency room.  Otherwise follow-up in 1 year or sooner if needed.  07/29/2023 12:04 PM   Disclaimer: This note was dictated with voice recognition software. Similar sounding words can inadvertently be transcribed and may not be corrected upon review.

## 2023-11-30 ENCOUNTER — Encounter: Payer: Self-pay | Admitting: Nurse Practitioner

## 2023-12-01 ENCOUNTER — Other Ambulatory Visit (HOSPITAL_COMMUNITY): Payer: Self-pay | Admitting: Nurse Practitioner

## 2023-12-01 DIAGNOSIS — Z13820 Encounter for screening for osteoporosis: Secondary | ICD-10-CM

## 2023-12-08 ENCOUNTER — Ambulatory Visit (HOSPITAL_COMMUNITY)
Admission: RE | Admit: 2023-12-08 | Discharge: 2023-12-08 | Disposition: A | Discharge status: Home / Self Care | Source: Ambulatory Visit | Attending: Nurse Practitioner | Admitting: Nurse Practitioner

## 2023-12-08 DIAGNOSIS — Z13820 Encounter for screening for osteoporosis: Secondary | ICD-10-CM | POA: Insufficient documentation

## 2023-12-08 DIAGNOSIS — M810 Age-related osteoporosis without current pathological fracture: Secondary | ICD-10-CM | POA: Insufficient documentation

## 2024-01-04 ENCOUNTER — Other Ambulatory Visit: Payer: Self-pay

## 2024-01-04 ENCOUNTER — Telehealth: Payer: Self-pay

## 2024-01-04 DIAGNOSIS — M810 Age-related osteoporosis without current pathological fracture: Secondary | ICD-10-CM | POA: Insufficient documentation

## 2024-01-04 NOTE — Telephone Encounter (Addendum)
 Auth Submission: APPROVED Site of care: Site of care: [facility] Payer: [employer] medicare Medication & CPT/J Code(s) submitted: Prolia (Denosumab) [id number] Diagnosis Code:  Route of submission (phone, fax, portal): portal Phone # Fax # Auth type: Buy/Bill PB Units/visits requested: 60mg  q51months x 2 doses Reference number: [id number] Approval from: 01/04/24 to 01/03/2025

## 2024-05-06 ENCOUNTER — Other Ambulatory Visit: Payer: Self-pay | Admitting: Internal Medicine

## 2024-06-23 ENCOUNTER — Other Ambulatory Visit (HOSPITAL_COMMUNITY): Payer: Self-pay | Admitting: Internal Medicine

## 2024-06-23 DIAGNOSIS — Z1231 Encounter for screening mammogram for malignant neoplasm of breast: Secondary | ICD-10-CM

## 2024-07-25 ENCOUNTER — Ambulatory Visit (HOSPITAL_COMMUNITY)
